# Patient Record
Sex: Female | Born: 1960 | Race: Black or African American | Hispanic: No | Marital: Single
Health system: Southern US, Community
[De-identification: ages and names within clinical notes are randomized; demographics above are authoritative.]

## PROBLEM LIST (undated history)

## (undated) DIAGNOSIS — I639 Cerebral infarction, unspecified: Secondary | ICD-10-CM

## (undated) DIAGNOSIS — J449 Chronic obstructive pulmonary disease, unspecified: Secondary | ICD-10-CM

## (undated) DIAGNOSIS — I10 Essential (primary) hypertension: Secondary | ICD-10-CM

## (undated) DIAGNOSIS — G4730 Sleep apnea, unspecified: Secondary | ICD-10-CM

## (undated) HISTORY — DX: Cerebral infarction, unspecified: I63.9

## (undated) HISTORY — DX: Chronic obstructive pulmonary disease, unspecified: J44.9

## (undated) HISTORY — DX: Sleep apnea, unspecified: G47.30

## (undated) HISTORY — DX: Essential (primary) hypertension: I10

---

## 2006-04-08 ENCOUNTER — Ambulatory Visit: Payer: Self-pay | Admitting: Pulmonary Disease

## 2006-04-08 ENCOUNTER — Inpatient Hospital Stay (HOSPITAL_COMMUNITY): Admission: AD | Admit: 2006-04-08 | Discharge: 2006-04-13 | Payer: Self-pay | Admitting: Pulmonary Disease

## 2006-04-11 ENCOUNTER — Encounter (INDEPENDENT_AMBULATORY_CARE_PROVIDER_SITE_OTHER): Payer: Self-pay | Admitting: Specialist

## 2007-10-15 IMAGING — CR DG CHEST 1V PORT
1 series · 1 of 1 positions shown · non-contrast
Comparison: None.

CLINICAL DATA: Hemoptysis.  Possible tuberculosis.
 AP PORTABLE CHEST ? 1 VIEW ? 04/09/06 AT 8788 HOURS:

[view not recorded]
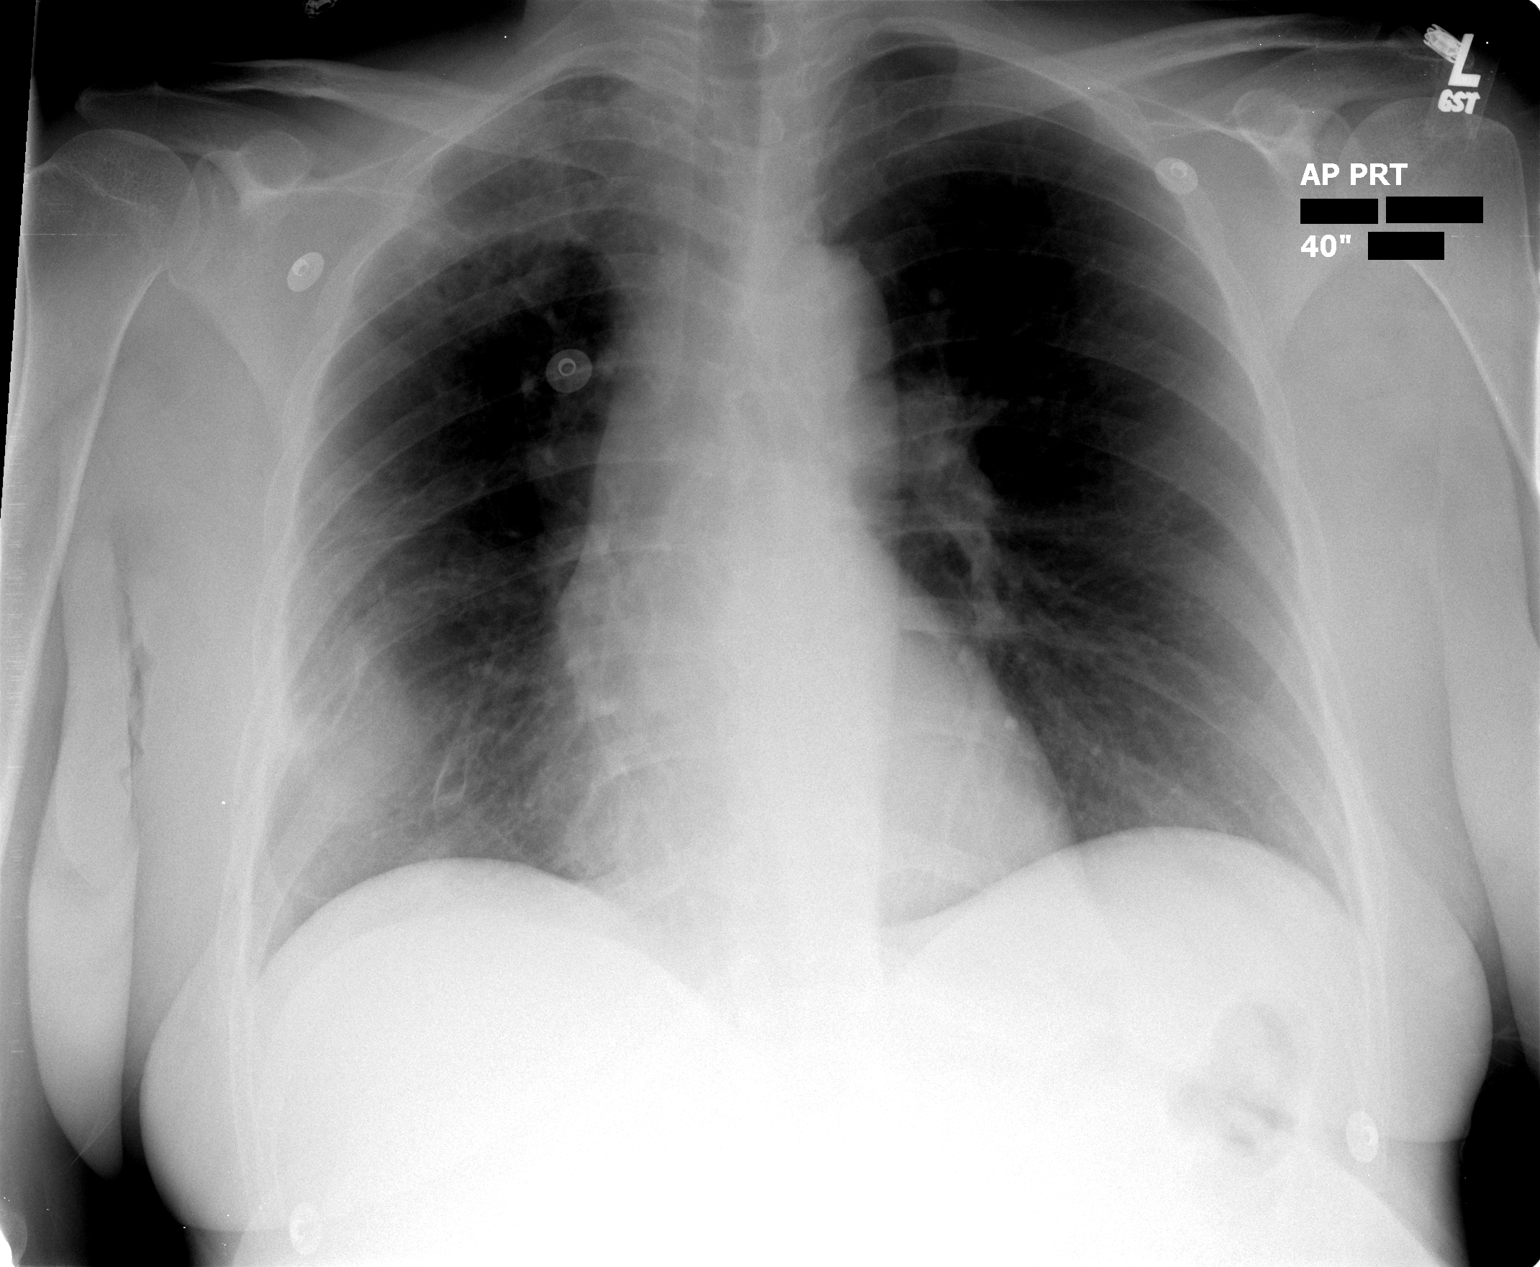

[1 of 1 positions shown; findings below may reference images not displayed]

FINDINGS: Cavitary lesion in the right upper lobe measures approximately 6 cm.  There is a focal alveolar density in the right lung base.  The left lung is clear.  The cardiopericardial silhouette is within normal limits for size.  The bony structures of the visualized thorax are intact.
IMPRESSION: A 6 cm cavitary right upper lobe pulmonary lesion with ill-defined density at the right lung base.  This could be related to an infectious process, including tuberculosis, but neoplasm cannot be excluded.  Close clinical correlation is recommended, and consider follow-up CT to further evaluate.

## 2009-09-14 ENCOUNTER — Other Ambulatory Visit: Admission: RE | Admit: 2009-09-14 | Discharge: 2009-09-14 | Payer: Self-pay | Admitting: Oncology

## 2009-10-13 ENCOUNTER — Ambulatory Visit: Payer: Self-pay | Admitting: Internal Medicine

## 2009-10-17 LAB — CBC WITH DIFFERENTIAL/PLATELET
BASO%: 0.8 % (ref 0.0–2.0)
EOS%: 1.5 % (ref 0.0–7.0)
HCT: 36.7 % (ref 34.8–46.6)
LYMPH%: 47.8 % (ref 14.0–49.7)
MCH: 28 pg (ref 25.1–34.0)
MCHC: 33.3 g/dL (ref 31.5–36.0)
MCV: 84 fL (ref 79.5–101.0)
MONO#: 0.2 10*3/uL (ref 0.1–0.9)
MONO%: 6.2 % (ref 0.0–14.0)
NEUT%: 43.7 % (ref 38.4–76.8)
Platelets: 169 10*3/uL (ref 145–400)

## 2009-10-17 LAB — COMPREHENSIVE METABOLIC PANEL
ALT: 8 U/L (ref 0–35)
CO2: 23 mEq/L (ref 19–32)
Creatinine, Ser: 0.89 mg/dL (ref 0.40–1.20)
Total Bilirubin: 0.2 mg/dL — ABNORMAL LOW (ref 0.3–1.2)

## 2009-10-17 LAB — LACTATE DEHYDROGENASE: LDH: 137 U/L (ref 94–250)

## 2009-11-02 ENCOUNTER — Ambulatory Visit (HOSPITAL_COMMUNITY): Admission: RE | Admit: 2009-11-02 | Discharge: 2009-11-02 | Payer: Self-pay | Admitting: Internal Medicine

## 2009-11-14 ENCOUNTER — Ambulatory Visit: Payer: Self-pay | Admitting: Internal Medicine

## 2009-11-16 LAB — CBC WITH DIFFERENTIAL/PLATELET
Basophils Absolute: 0 10*3/uL (ref 0.0–0.1)
HCT: 36.8 % (ref 34.8–46.6)
HGB: 11.9 g/dL (ref 11.6–15.9)
MCH: 27.4 pg (ref 25.1–34.0)
MONO#: 0.2 10*3/uL (ref 0.1–0.9)
NEUT%: 46.6 % (ref 38.4–76.8)
lymph#: 1.3 10*3/uL (ref 0.9–3.3)

## 2009-11-17 LAB — HEPATITIS PANEL, ACUTE
HCV Ab: NEGATIVE
Hep A IgM: NEGATIVE
Hep B C IgM: NEGATIVE
Hepatitis B Surface Ag: NEGATIVE

## 2009-11-17 LAB — RHEUMATOID FACTOR: Rhuematoid fact SerPl-aCnc: <20 IU/mL (ref 0–20)

## 2010-01-21 ENCOUNTER — Encounter: Payer: Self-pay | Admitting: Internal Medicine

## 2010-03-14 LAB — PROTIME-INR: INR: 0.98 (ref 0.00–1.49)

## 2010-03-14 LAB — CBC
Platelets: 173 10*3/uL (ref 150–400)
RDW: 13 % (ref 11.5–15.5)
WBC: 3 10*3/uL — ABNORMAL LOW (ref 4.0–10.5)

## 2010-03-14 LAB — BASIC METABOLIC PANEL
BUN: 14 mg/dL (ref 6–23)
Creatinine, Ser: 0.81 mg/dL (ref 0.4–1.2)
GFR calc non Af Amer: >60 mL/min (ref >60)

## 2010-03-14 LAB — APTT: aPTT: 28 seconds (ref 24–37)

## 2010-05-19 NOTE — Op Note (Signed)
NAME:  Lori Wall, Lori Wall                ACCOUNT NO.:  [id number]   MEDICAL RECORD NO.:  [id number]          PATIENT TYPE:  INP   LOCATION:  6702                         FACILITY:  [facility]   PHYSICIAN:  Coralyn Helling, MD        DATE OF BIRTH:  06-Mar-1960   DATE OF PROCEDURE:  DATE OF DISCHARGE:                               OPERATIVE REPORT   PROCEDURE:  Bronchoscopy with bronchoalveolar lavage and brush cytology.   INDICATIONS:  This is a 50 year old female who was diagnosed with  tuberculosis in 2006.  She received direct observational therapy.  She  presented with recurrence of hemoptysis and was found to have a new  right upper lobe cavitary lesion.  The procedure was explained to the  patient.  Informed consent was signed.   The patient was brought to the bronchoscopy suite.  She was placed in  respiratory isolation.  She was given a total of 10 mg of Versed and 100  mcg of fentanyl intravenously for sedation and analgesia.  She had  Cetacaine spray to her posterior pharynx.  The bronchoscope was entered  through her mouth.  The vocal cords were visualized and appeared normal  with normal movement.  Lidocaine 2% was instilled as needed for topical  anesthesia.  The bronchoscope was entered into the trachea.  The carina  was visualized and did not appear to be widened.  The bronchoscope was  then entered into the left main bronchus.  The left upper, lingular and  lower lobe orifices were visualized.  There were no obvious  endobronchial lesions and the mucosa appeared normal.  The bronchoscope  was then entered into the right main bronchus.  The right middle and  lower lobe orifices were visualized and appeared to have normal mucosa  with no obvious endobronchial lesions.  Focus was then shifted to the  right upper lobe bronchus.  There are no obvious endobronchial lesions.  Mucosa appeared normal.  A total of 120 mL of saline was instilled to  the right upper lobe for bronchoalveolar  lavage with cloudy white-  colored fluid returning.  Brush cytology was then done from the right  upper lobe.  There was a minimal amount of bleeding, which resolved  spontaneously.  The patient had an episode of sinus tachycardia, which  resolved at the end of the procedure.  The bronchoscope was withdrawn  and there were no immediate obvious complications.  The patient was  recovered in the bronchoscopy suite.  The specimens will be sent for  microbiology and cytology, and I will discuss the results with the  patient when they are available.      Coralyn Helling, MD  Electronically Signed     VS/MEDQ  D:  04/11/2006  T:  04/12/2006  Job:  [id number]

## 2010-05-19 NOTE — H&P (Signed)
NAME:  Lori Wall, Lori Wall                ACCOUNT NO.:  [id number]   MEDICAL RECORD NO.:  [id number]          PATIENT TYPE:  INP   LOCATION:  2912                         FACILITY:  [facility]   PHYSICIAN:  Coralyn Helling, MD        DATE OF BIRTH:  1960/04/12   DATE OF ADMISSION:  04/08/2006  DATE OF DISCHARGE:                              HISTORY & PHYSICAL   ADMISSION DIAGNOSES:  Hemoptysis and abnormal chest x-ray.   Lori Wall is a 50 year old female who was received in transfer from  [facility] [place]  She had  presented there after having developed episodes of coughing with sputum  mixed with blood.  She said that this had started earlier in the day.  She denied having any fever, chills, sweats, weight loss.  She denies  any headaches, dizziness, blurred vision, sinus congestion, epistaxis or  difficulty swallowing.  She also denies any abdominal pain, nausea or  vomiting.  She denies any diarrhea, leg swelling, skin rashes or joint  swelling.  She says that she does have a history of alcohol abuse but  that she has not drunk much over the last 1 month, although she did say  that she had few drinks at a wedding recently. She denies any recent  sick exposure.  She denies any travel history.  There is no significant  occupational exposure.  She did not have any exposure to animals, pets  or birds.   PAST MEDICAL HISTORY:  Significant for:  1. Alcohol abuse.  2. Cocaine abuse.  3. Reflux.  4. Hypertension.  5. Alcoholic ketoacidosis.  6. Pancreatitis.   PAST SURGERIES:  History significant for cholecystectomy and  hysterectomy.   ALLERGIES:  She has no known drug allergies.   MEDICATIONS:  1. Hydrocodone 5/500 1 tablet q.4 h p.r.n.  2. Folic acid 1 mg daily.  3. Cefdinir 300 mg b.i.d.  4. Thiamine 100 mg daily.  5. Zofran 2 mg b.i.d. p.r.n.  6. Prevacid 30 mg daily.  7. Aspirin 11 mg daily.  8. Prenatal Plus with iron 2 tablets once daily.  9.  Cerefolin MAC 1 tablet daily.  10.Micardis 40 mg daily.  11.Cymbalta 60 mg daily.  12.Levemir 10 units nightly.   FAMILY HISTORY:  Noncontributory.   REVIEW OF SYSTEMS:  Essentially negative except as stated above.   PHYSICAL EXAMINATION:  VITAL SIGNS:  Temperature is 98.7, blood pressure  137/96, heart rate 91, respiratory rate 15.  Saturation 100% on room  air.  HEENT: Pupils reactive.  There is no sinus tenderness.  No nasal  discharge.  No oral lesions.  NECK:  No lymphadenopathy.  HEART:  S1 and S2 with regular rate and rhythm.  CHEST:  No wheezing or rales.  ABDOMEN:  Thin, soft, nontender.  Positive bowel sounds.  EXTREMITIES: There is no edema, cyanosis or clubbing.  NEUROLOGIC:  No focal deficits were appreciated.   CT scan of the chest done at [facility] from earlier  in the day showed a cavitary lesion in the right upper lobe as well  as a  pleural-based lesion in the right lower lung fields which have some  calcifications.   Sodium is 137, potassium 3.2, chloride 110, CO2 20, BUN 10, creatinine  0.7, glucose 99,  calcium 9.4. INR is 1.05.  WBC 2.8, hemoglobin 10.9,  hematocrit 31.1, platelet count 161.   EKG showed sinus tachycardia   IMPRESSION:  1. This is a 50 year old female who is received in transfer with scant      hemoptysis and abnormalities on CT scan as stated above.  She does      have a history of previous tobacco abuse as well as cocaine abuse      in addition to alcohol abuse.  The concern that I would have is      that this may represent either an infectious etiology versus      malignancy.  I will evaluate her first for possibility of      tuberculosis. I will place her on isolation, check sputum for AFB,      and place a PPD. If she is not able to expectorate on her own, I      will have respiratory therapy induce sputum.  Depending upon the      results of this, I would consider having her undergo bronchoscopy.      I will  additionally start her on Augmentin in case she has had an      episode of aspiration with abscess formation.  2. Hypertension.  I will continue her on Micardis.  3. Anxiety.  I will continue her on Cymbalta.  4. Diabetes.  I will continue her on Lantus and place her on a sliding      scale protocol as well as modified-carbohydrate diet.  5. History of substance abuse.  I will continue her on thiamine and      folic acid.  I will also check a urine drug screen as well as an      HIV test.  6. I will have her transferred to a medical floor in isolation.      Coralyn Helling, MD  Electronically Signed     VS/MEDQ  D:  04/08/2006  T:  04/08/2006  Job:  [id number]

## 2010-05-19 NOTE — Discharge Summary (Signed)
NAME:  Lori Wall, Lori Wall                ACCOUNT NO.:  [id number]   MEDICAL RECORD NO.:  [id number]          PATIENT TYPE:  INP   LOCATION:  6702                         FACILITY:  [facility]   PHYSICIAN:  Lonzo Cloud. Kriste Basque, MD     DATE OF BIRTH:  1960/11/30   DATE OF ADMISSION:  04/08/2006  DATE OF DISCHARGE:  04/13/2006                               DISCHARGE SUMMARY   FINAL DIAGNOSES:  1. Admitted April 08, 2006 on transfer from [facility] by Dr. Ivette Loyal      with hemoptysis and an abnormal chest x-ray. The patient has a      history of TB treated at the [facility] in      2006 with a four-drug regimen and careful monitoring. By all      accounts, TB has been adequately treated and eradicated. Current      chest x-ray showed a cavity right upper lobe lesion, and patient      had sputum with streaky hemoptysis.  2. The patient was accepted in transfer to [facility] in      [place] by Dr. Danice Goltz. She was attended by Dr. Coralyn Helling and discharged by Dr. Alroy Dust. Evaluation included      expectorated and induced sputum for AFB - all negative.      Bronchoscopy with lavage - all AFB smears negative, and cultures      will be pending over the next six weeks. Post bronchoscopy sputum      specimen negative as well. The patient treated for right upper lobe      cavity with Augmentin with clinical improvement and is being      discharged to finish an outpatient course with careful follow up by      her local physicians in [facility].  3. History of hypertension.  4. History of polysubstance abuse.  5. History of chronic pancreatitis.  6. Anemia, hemoglobin 9.6.  7. HIV antibody testing, indeterminate for HIV-1 and negative for HIV-      2 - this should be followed up by her local physicians with repeat      testing.   BRIEF HISTORY AND PHYSICAL:  This 50 year old black female  was  transferred from [facility] in [place], [place]  with hemoptysis. She reported sputum mixed with blood that started  earlier on the day of admission. She denied any fevers, chills, sweats  or weight loss. She states her appetite is good. She denied any  headache, dizziness, blurred vision, sinus congestion, epistaxis or  difficulty swallowing. She also denied abdominal pain, nausea and  vomiting. She noted a history of alcohol abuse but states she had had  nothing to drink for the last month prior to admission. She denied any  recent sick exposures or recent travel. Similarly, there was no  significant occupational exposure or exposure to animals, pets or birds.  She denied chest pain, pleuritic or otherwise.   PAST MEDICAL HISTORY:  She has a history of pulmonary TB diagnosed in  December of 2006 at [facility] in [place]. She is  followed in the [facility] Department and received  a full course of chemotherapy under observation. By all accounts, she  had her TB eradicated with this therapy and had had no problems until  this admission. As noted, she does have a history of alcohol abuse and  polysubstance abuse. She did not know her HIV status. She has insulin-  dependent diabetes and is followed by her local physician.   Her home medications include Micardis for hypertension, Cymbalta for  depression, Levimir insulin, Prevacid, vitamin regimen and Vicodin as  needed for pain.   PHYSICAL EXAMINATION:  At the time of admission revealed a 50 year old  black female in no acute distress. Blood pressure 130/90, pulse 90 and  regular, respirations 16 per minute and nonlabored. O2 saturation 100%  on room air. Temperature 98.6.  HEENT EXAM:  Unremarkable.  NECK:  Revealed no lymphadenopathy.  CHEST EXAM:  Was clear to percussion and auscultation.  CARDIAC EXAM:  Revealed a regular rhythm, normal S1 and S2 without  murmurs,  rubs, or gallops heard.  ABDOMEN:  Was thin, soft, nontender without evidence of organomegaly or  masses.  EXTREMITIES:  Showed no clubbing, cyanosis, or edema.  NEUROLOGICAL EXAM:  Intact without abnormalities detected.   LABORATORY DATA:  Chest x-ray revealed a 6-cm right upper lobe cavity.  She had numerous labs from the transferring emergency room which were  reviewed. She had CBC here that showed a hemoglobin of 9.6, hematocrit  28.5, white count 3500 and a platelet count of 163,000. Her sodium was  133, potassium 3.3, chloride 97, CO2 25, BUN 6, creatinine 0.6, blood  sugar 92. Urine drug screen was negative across the board. She had  initial sputum studies which were negative for bacterial culture,  negative AFB and fungal smears. She subsequently had bronchoscopy with  lavage, and the lavage fluid was negative for AFB and fungi and cultured  only normal oropharyngeal flora. She had a post bronchoscopic sputum  specimen as well as that was negative for AFB and fungi. She had a  bronchial brushing from the right upper lobe that was negative for AFB  as well.   HOSPITAL COURSE:  The patient was attended by Dr. Coralyn Helling during her  hospitalization. Dr. Craige Cotta started her on Augmentin and obtained old  records from [facility]. When expectorated and induced sputum  was negative, he performed a bronchoscopy on April 11, 2006 which showed  no evidence of any endobronchial abnormality. As noted, the bronchial  washings were negative for AFB and fungi and bronchial brushings. The  right upper lobe was negative as well. She tolerated the procedure well.   When specimens returned negative for AFB and the patient had improved  clinically with resolution of her hemoptysis on the Augmentin, she was  felt to be ready for discharge. The patient is discharged on Augmentin  875 one tablet p.o. b.i.d. with planned followup by her physicians in [facility] including Dr. Neldon Mc and  her pulmonologist, Dr. Ozella Rocks. She will also follow up with [facility]  Department. The patient was informed that her HIV status showed  indeterminate for HIV-1 and negative for HIV-2, and she was told this  will need to be repeated by her physicians in [facility]. She was  given a prescription for the Augmentin and discharged improved.  MEDICATIONS AT DISCHARGE:  Augmentin 875 one tablet p.o. b.i.d. for 7  more days. The patient was told to resume her home medicines including:  1. Micardis 40 mg p.o. daily.  2. Prevacid 30 mg p.o. daily.  3. Cymbalta 60 mg p.o. daily.  4. Levimir insulin 10 units a day.  5. Multivitamin daily.  6. Thiamine 100 mg p.o. daily.  7. Folic acid 1 mg p.o. daily.  8. Vicodin as needed for pain.      Lonzo Cloud. Kriste Basque, MD  Electronically Signed     SMN/MEDQ  D:  04/13/2006  T:  04/13/2006  Job:  [id number]   cc:   [facility]  Jonette Mate, M.D.  Ozella Rocks, M.D.  Ivette Loyal, M.D.  Daleen Bo, M.D.

## 2011-05-10 IMAGING — CT CT BIOPSY
1 series · 15 of 31 positions shown, 19 images · non-contrast
Comparison: none

CLINICAL DATA: Neutropenia.

[Series 2: bone marrow bx · axial · 0.55mm/px · z∈[-222,-137]mm · 15 of 31 slices shown, 19 images]
[im 3/31  soft-tissue]
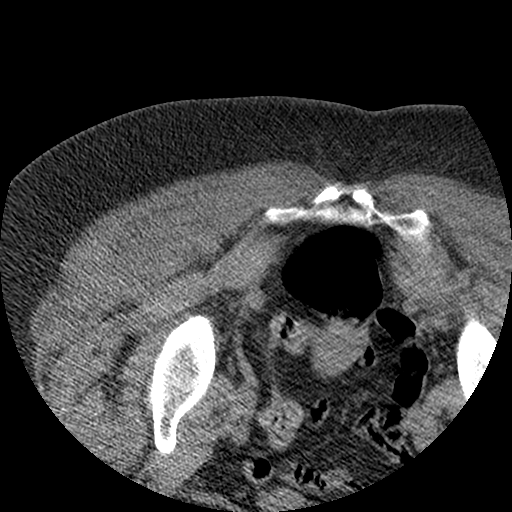
[im 3/31  bone]
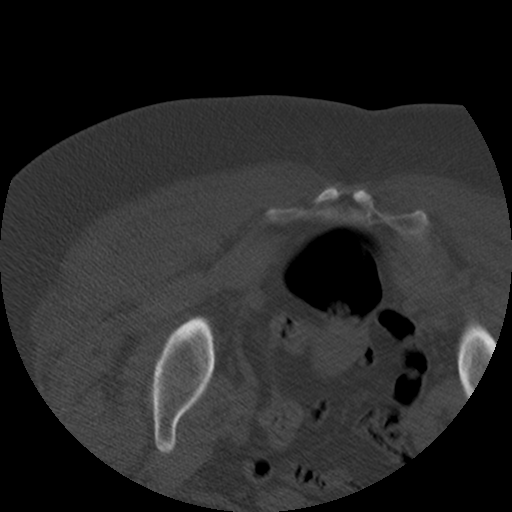
[im 5/31  soft-tissue]
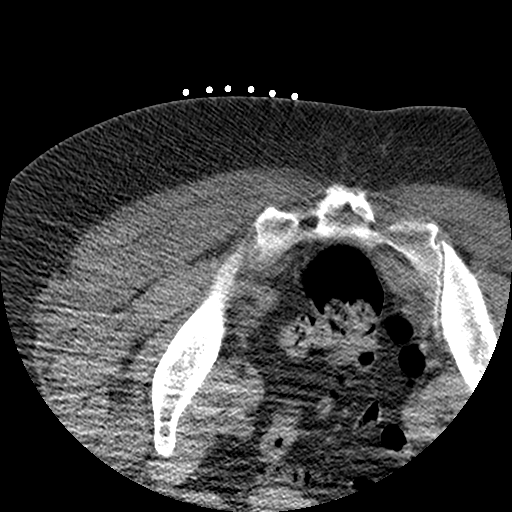
[im 7/31  soft-tissue]
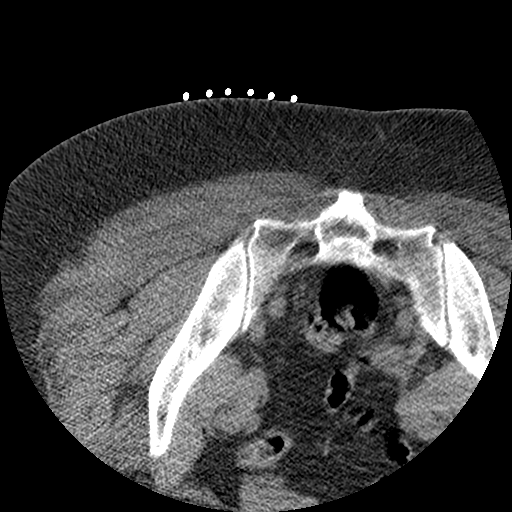
[im 9/31  soft-tissue]
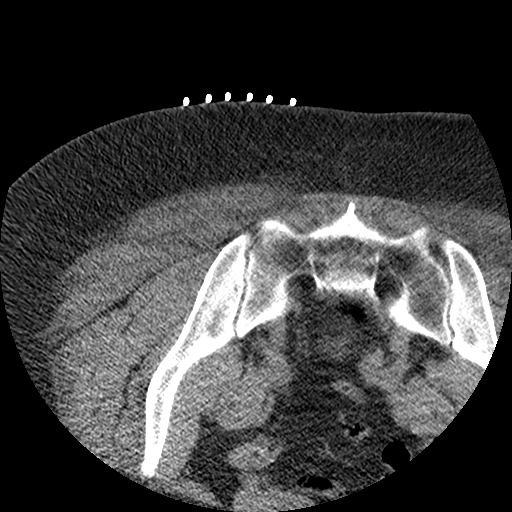
[im 12/31  soft-tissue]
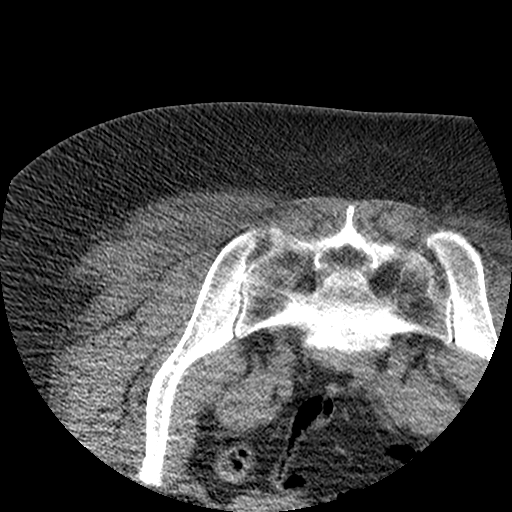
[im 14/31  soft-tissue]
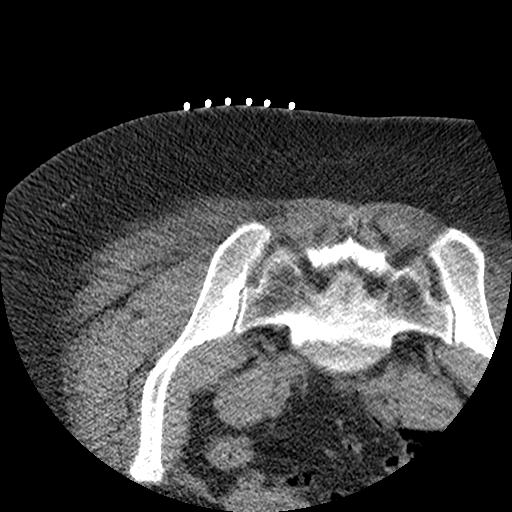
[im 16/31  soft-tissue]
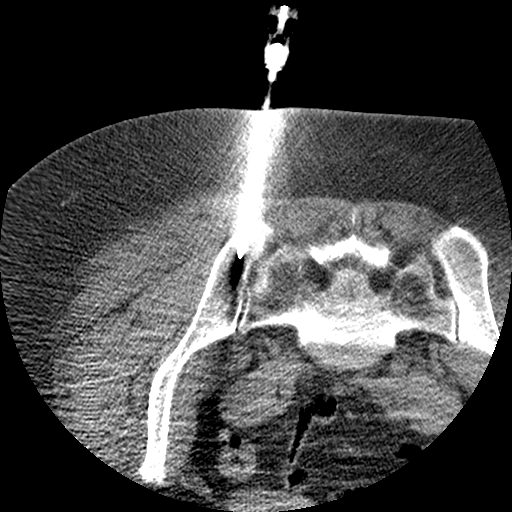
[im 18/31  soft-tissue]
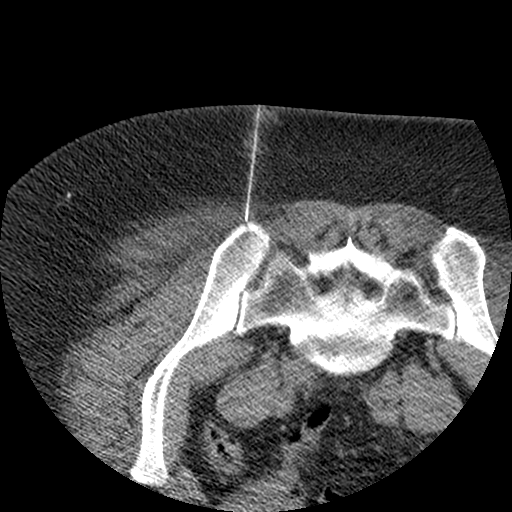
[im 20/31  soft-tissue]
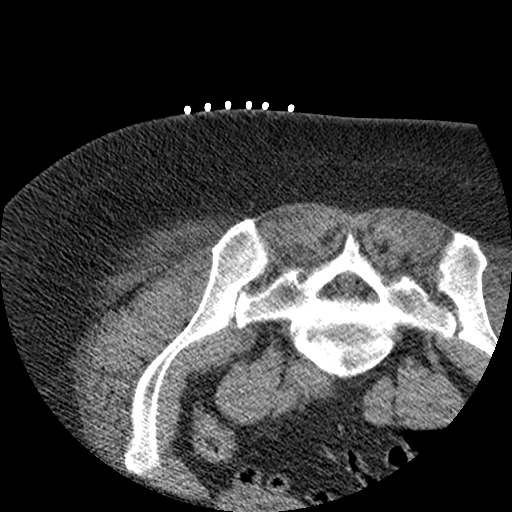
[im 20/31  bone]
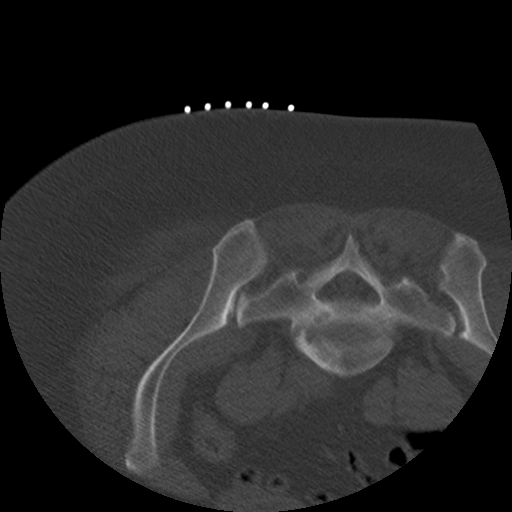
[im 23/31  soft-tissue]
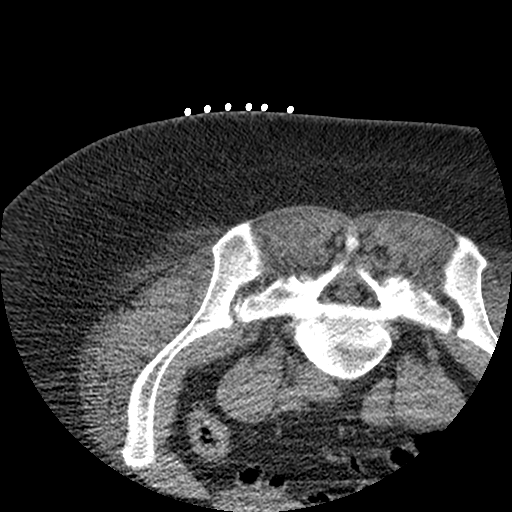
[im 25/31  soft-tissue]
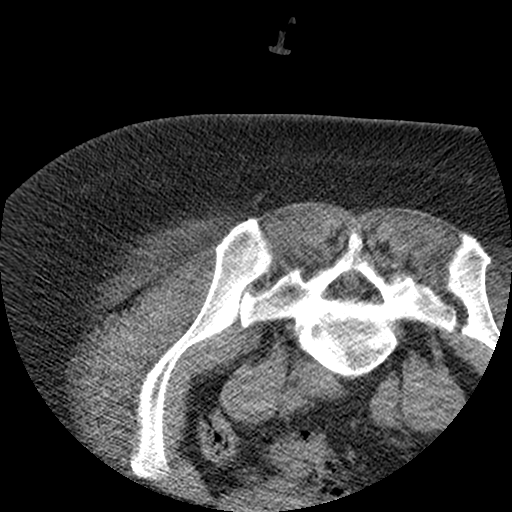
[im 27/31  soft-tissue]
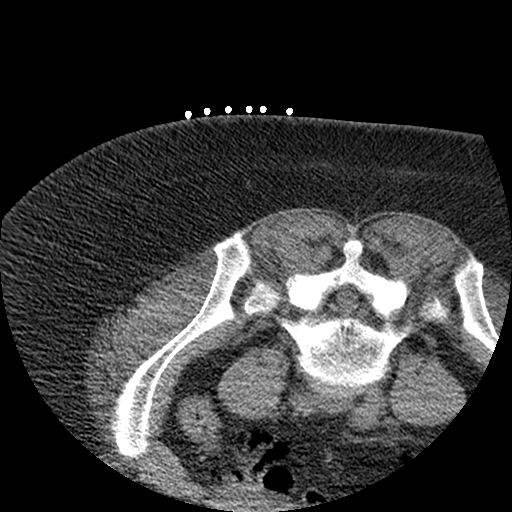
[im 27/31  lung]
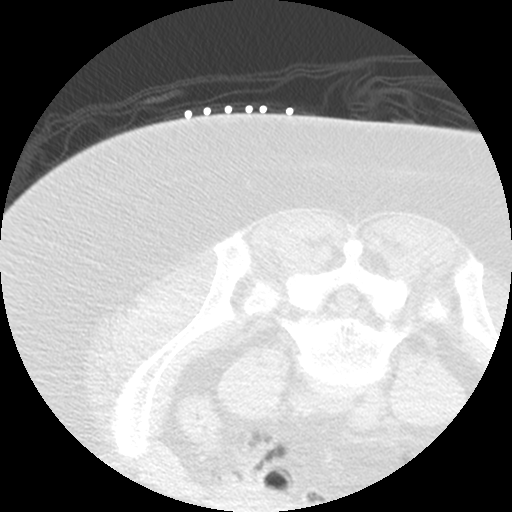
[im 28/31  lung]
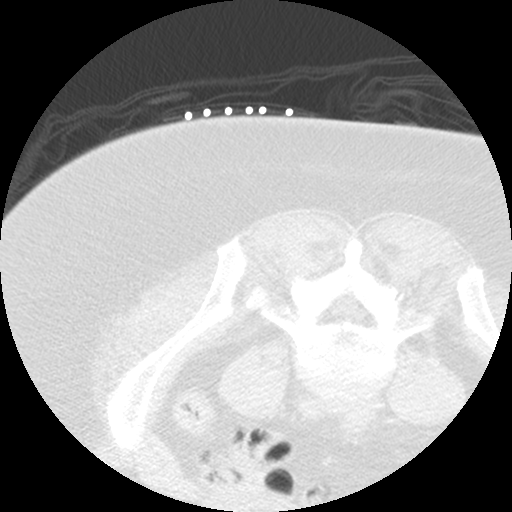
[im 29/31  soft-tissue]
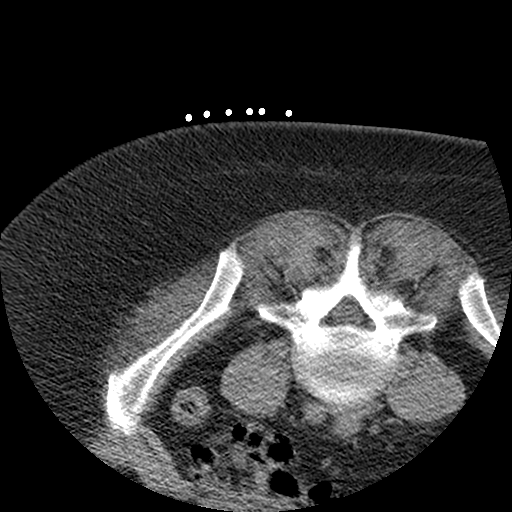
[im 29/31  lung]
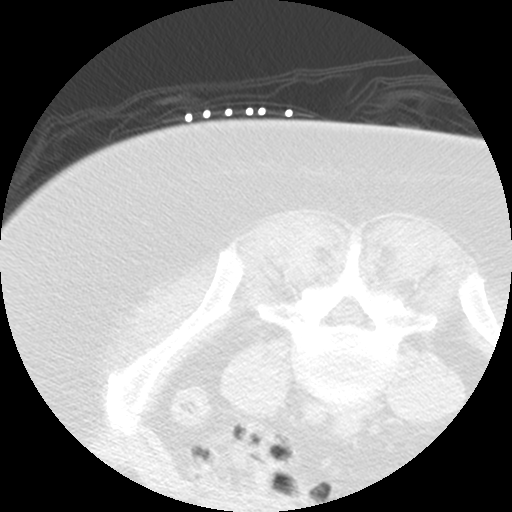
[im 30/31  lung]
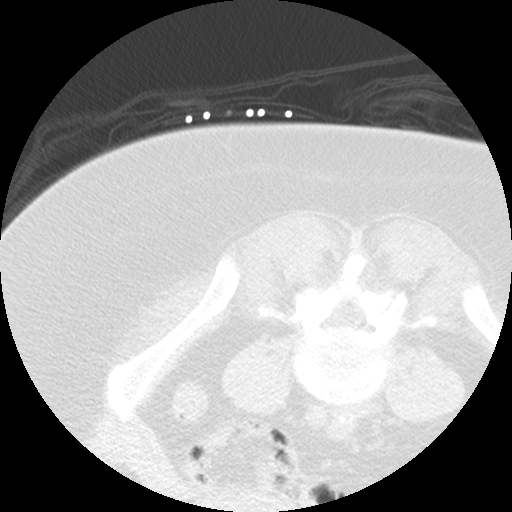

[15 of 31 positions shown; findings below may reference images not displayed]

CT GUIDED ASPIRATE AND CORE  BIOPSY OF  LEFT ILIAC BONE MARROW

Sedation: Versed 2.5 mg IV, Fentanyl 100 mcg IV

Total Moderate Sedation Time: 12 minutes.

Procedure:  The procedure risks, benefits, and alternatives were
explained to the patient.  Questions regarding the procedure were
encouraged and answered.  The patient understands and consents to
the procedure.

The left gluteal region was prepped with Betadine.  Sterile gown
and sterile gloves were used for the procedure.  Local anesthesia
was provided with 1% Lidocaine.

Under CT guidance, an 11 gauge bone cutting needle was advanced
from a posterior approach into the left iliac bone.  Needle
positioning was confirmed with CT.  Initial non heparinized and
heparinized aspirate samples were obtained of bone marrow.

Core biopsy was performed with coaxial placement of a 14 gauge core
biopsy needle.  Multiple core samples of the marrow were obtained.

Complications: None
FINDINGS: Inspection of initial aspirate did reveal visible
particles.  Intact core biopsy samples were able to be obtained.
IMPRESSION: CT guided bone marrow biopsy of left posterior iliac bone with both
aspirate and core samples obtained.

## 2016-04-27 DIAGNOSIS — D72819 Decreased white blood cell count, unspecified: Secondary | ICD-10-CM | POA: Diagnosis not present

## 2016-04-27 DIAGNOSIS — K7460 Unspecified cirrhosis of liver: Secondary | ICD-10-CM | POA: Diagnosis not present

## 2016-04-27 DIAGNOSIS — Z86010 Personal history of colonic polyps: Secondary | ICD-10-CM | POA: Diagnosis not present

## 2017-09-04 DIAGNOSIS — K7460 Unspecified cirrhosis of liver: Secondary | ICD-10-CM

## 2017-09-04 DIAGNOSIS — D709 Neutropenia, unspecified: Secondary | ICD-10-CM

## 2017-09-04 DIAGNOSIS — D72819 Decreased white blood cell count, unspecified: Secondary | ICD-10-CM | POA: Diagnosis not present

## 2018-01-10 ENCOUNTER — Encounter: Payer: Self-pay | Admitting: Neurology

## 2018-02-25 ENCOUNTER — Encounter: Payer: Self-pay | Admitting: Neurology

## 2018-02-25 ENCOUNTER — Ambulatory Visit (INDEPENDENT_AMBULATORY_CARE_PROVIDER_SITE_OTHER): Payer: Medicare Other | Admitting: Neurology

## 2018-02-25 ENCOUNTER — Other Ambulatory Visit: Payer: Self-pay

## 2018-02-25 ENCOUNTER — Other Ambulatory Visit (INDEPENDENT_AMBULATORY_CARE_PROVIDER_SITE_OTHER): Payer: Medicare Other

## 2018-02-25 VITALS — BP 110/72 | HR 77 | Ht 66.0 in | Wt 218.0 lb

## 2018-02-25 DIAGNOSIS — F0150 Vascular dementia without behavioral disturbance: Secondary | ICD-10-CM | POA: Diagnosis not present

## 2018-02-25 DIAGNOSIS — R413 Other amnesia: Secondary | ICD-10-CM

## 2018-02-25 MED ORDER — DONEPEZIL HCL 10 MG PO TABS: ORAL_TABLET | ORAL | 11 refills | Status: DC

## 2018-02-25 NOTE — Progress Notes (Signed)
NEUROLOGY CONSULTATION NOTE  Lori Wall MRN: [id number] DOB: 1960-06-05  Referring provider: Welford Roche, NP Primary care provider: Welford Roche, NP  Reason for consult:  forgetfulness  Thank you for your kind referral of Lori Wall for consultation of the above symptoms. Although her history is well known to you, please allow me to reiterate it for the purpose of our medical record. The patient was accompanied to the clinic by her daughter who also provides collateral information. Records and images were personally reviewed where available.  HISTORY OF PRESENT ILLNESS: This is a pleasant 58 year old right-handed woman with a history of hypertension, sleep apnea, stroke in 2008 with residual mild right-sided weakness, presenting for evaluation of forgetfulness. She is a poor historian and mostly says that she is forgetful. Her daughter provides majority of the history. Her daughter reports that she had a seizure first in 2008 then had the stroke affecting her right side and speech. She was on life support for a week. She was living in [place] at that time, she had to start all over again, learning to eat, talk, and walk. Her daughter reports the stroke was due to alcohol abuse, she thinks there may have been a bleed (no prior records available). She moved in with her daughter after the stroke and has not been drinking any alcohol since 2008. Her daughter reports "brain damage" from the stroke with some memory issues, that have worsened over the past 3-4 months where family would ask her to do something and she would do something else. For instance, they would ask her to get the trash and she would go to the sink instead. Family has also noticed that she is more off balance, no falls. The patient states "I keep forgetting." She misplaces her glasses frequently and says her daughter moved it. She has left the faucet running. She denies leaving the stove on. Her daughter has been  managing finances and medications since the stroke. She does not drive. She is noted to be looking off to a distance several times during the visit, sometimes with a brief lag in response but answers questions appropriate after. Her daughter reports she does this at home sometimes, no unresponsive episodes. No further seizures since 2008. She has had headaches since the stroke and was previously seen in a neurology practice in [place] in 2016 where she had an MRI brain reporting progressive atrophy and white matter disease since 2010. 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 This abnormality was not present in 2010. She had a follow-up CTA reporting distal right vertebral artery is patent but non dominant. This was suspected to be a normal anatomic variation rather than related to right vertebral artery stenosis in the neck. Headaches occur every other day with pressure in the left frontal region, no associated nausea/vomiting. She is sensitive to bright lights. She has been taking verapamil for HTN and headache prevention. She has been taking oxycodone BID for headaches since the stroke, no new changes in doses. She has occasional dizziness with spinning sensation when standing. No diplopia, dysphagia, neck/back pain, focal numbness/tingling, bowel/bladder dysfunction. No family history of dementia. No history of significant injuries. Mood is up and down, there is note of citalopram being started last month, but they deny taking it. Her daughter reports she has "always been tender-hearted." Daughter notes she is restless and cannot stay still. No paranoia or hallucinations. Sleep is good, she yells  in her sleep. They report her mother passed away last year and this has been very hard on her.   PAST MEDICAL HISTORY: Past Medical History:  Diagnosis Date  . COPD (chronic obstructive pulmonary disease) ([place])   . Hypertension   . Sleep apnea    . Stroke [facility])     PAST SURGICAL HISTORY: History reviewed. No pertinent surgical history.  MEDICATIONS:  Outpatient Encounter Medications as of 02/25/2018  Medication Sig  . Magnesium Oxide 250 MG TABS Take by mouth.  Karma Greaser 4 MG/0.1ML LIQD nasal spray kit   . Oxycodone HCl 10 MG TABS Take 10 mg by mouth 3 (three) times daily.  . verapamil (CALAN) 120 MG tablet Take by mouth.  .    .     No facility-administered encounter medications on file as of 02/25/2018.    ALLERGIES: No Known Allergies  FAMILY HISTORY: History reviewed. No pertinent family history.  SOCIAL HISTORY: Social History   Socioeconomic History  . Marital status: Single    Spouse name: Not on file  . Number of children: Not on file  . Years of education: Not on file  . Highest education level: Not on file  Occupational History  . Not on file  Social Needs  . Financial resource strain: Not on file  . Food insecurity:    Worry: Not on file    Inability: Not on file  . Transportation needs:    Medical: Not on file    Non-medical: Not on file  Tobacco Use  . Smoking status: Not on file  Substance and Sexual Activity  . Alcohol use: Not on file  . Drug use: Not on file  . Sexual activity: Not on file  Lifestyle  . Physical activity:    Days per week: Not on file    Minutes per session: Not on file  . Stress: Not on file  Relationships  . Social connections:    Talks on phone: Not on file    Gets together: Not on file    Attends religious service: Not on file    Active member of club or organization: Not on file    Attends meetings of clubs or organizations: Not on file    Relationship status: Not on file  . Intimate partner violence:    Fear of current or ex partner: Not on file    Emotionally abused: Not on file    Physically abused: Not on file    Forced sexual activity: Not on file  Other Topics Concern  . Not on file  Social History Narrative  . Not on file    REVIEW OF  SYSTEMS: Constitutional: No fevers, chills, or sweats, no generalized fatigue, change in appetite Eyes: No visual changes, double vision, eye pain Ear, nose and throat: No hearing loss, ear pain, nasal congestion, sore throat Cardiovascular: No chest pain, palpitations Respiratory:  No shortness of breath at rest or with exertion, wheezes GastrointestinaI: No nausea, vomiting, diarrhea, abdominal pain, fecal incontinence Genitourinary:  No dysuria, urinary retention or frequency Musculoskeletal:  No neck pain, back pain Integumentary: No rash, pruritus, skin lesions Neurological: as above Psychiatric: No depression, insomnia, anxiety Endocrine: No palpitations, fatigue, diaphoresis, mood swings, change in appetite, change in weight, increased thirst Hematologic/Lymphatic:  No anemia, purpura, petechiae. Allergic/Immunologic: no itchy/runny eyes, nasal congestion, recent allergic reactions, rashes  PHYSICAL EXAM: Vitals:   02/25/18 0938  BP: 110/72  Pulse: 77  SpO2: 97%   General: No acute distress. Slowed  responses, looking off to a distance but would respond when called and answers appropriately, follows commands Head:  Normocephalic/atraumatic Eyes: Fundoscopic exam shows bilateral sharp discs, no vessel changes, exudates, or hemorrhages Neck: supple, no paraspinal tenderness, full range of motion Back: No paraspinal tenderness Heart: regular rate and rhythm Lungs: Clear to auscultation bilaterally. Vascular: No carotid bruits. Skin/Extremities: No rash, no edema Neurological Exam: Mental status: alert and oriented to person, place, and time, mild dysarthria, Difficulty naming and repeating. Fund of knowledge is reduced.  Recent and remote memory are impaired.  Attention and concentration are reduced.  Montreal Cognitive Assessment  02/25/2018  Visuospatial/ Executive (0/5) 3  Naming (0/3) 1  Attention: Read list of digits (0/2) 2  Attention: Read list of letters (0/1) 0    Attention: Serial 7 subtraction starting at 100 (0/3) 0  Language: Repeat phrase (0/2) 0  Language : Fluency (0/1) 0  Abstraction (0/2) 0  Delayed Recall (0/5) 3  Orientation (0/6) 5  Total 14  Adjusted Score (based on education) 15   Cranial nerves: CN I: not tested CN II: pupils equal, round and reactive to light, visual fields intact, fundi unremarkable. CN III, IV, VI:  full range of motion, no nystagmus, no ptosis CN V: facial sensation intact CN VII: upper and lower face symmetric CN VIII: hearing intact to finger rub CN IX, X: gag intact, uvula midline CN XI: sternocleidomastoid and trapezius muscles intact CN XII: tongue midline Bulk & Tone: normal, no fasciculations. Motor: 5/5 throughout with no pronator drift. Sensation: intact to light touch, cold, pin, vibration and joint position sense.  No extinction to double simultaneous stimulation.  Romberg test negative Deep Tendon Reflexes: +2 throughout except for absent ankle jerks bilaterally, no ankle clonus Plantar responses: downgoing bilaterally Cerebellar: no incoordination on finger to nose testing Gait: slow and cautious favoring right leg, denies pain, unable to tandem walk Tremor: none  IMPRESSION: This is a pleasant 58 year old right-handed woman with a history of hypertension, stroke in 2008 (?etiology, no notes available), presenting for evaluation of worsening memory. Her neurological exam shows mild right leg weakness (chronic since stroke), MOCA score today 15/30. Symptoms suggestive of vascular dementia. Prior MRI in 2016 reported worsening atrophy and chronic microvascular disease, repeat MRI brain without contrast will be done due to change in symptoms. Check TSH and B12. We discussed how mood can affect memory, it appears citalopram was mentioned by her PCP but she has not started it. Her daughter will look into counseling for her. She does not drive. We discussed starting Donepezil, side effects and  expectations from the medication were discussed. We also discussed the importance of control of vascular risk factors, physical exercise, and brain stimulation exercises for brain health. Follow-up in 6 months, they know to call for any changes.   Thank you for allowing me to participate in the care of this patient. Please do not hesitate to call for any questions or concerns.   Ellouise Newer, M.D.  CC: Welford Roche, NP

## 2018-02-25 NOTE — Patient Instructions (Addendum)
1. Schedule MRI brain without contrast  We have sent a referral to [facility] for your MRI.  They will be in touch with you to schedule your appointment.  2. Bloodwork for TSH, B12  Your provider requests that you have LABS drawn today.  We share a lab with [facility] - they are located in [address] (second floor) of this building.  Once you get there, please have a seat and the phlebotomist will call your name.  If you have waited more than 15 minutes, please advise the front desk    3. Start Donepezil 10mg : Take 1/2 tablet daily for 2 weeks, then increase to 1 tablet daily 4. Agree with seeing a therapist for mood  FALL PRECAUTIONS: Be cautious when walking. Scan the area for obstacles that may increase the risk of trips and falls. When getting up in the mornings, sit up at the edge of the bed for a few minutes before getting out of bed. Consider elevating the bed at the head end to avoid drop of blood pressure when getting up. Walk always in a well-lit room (use night lights in the walls). Avoid area rugs or power cords from appliances in the middle of the walkways. Use a walker or a cane if necessary and consider physical therapy for balance exercise. Get your eyesight checked regularly.  HOME SAFETY: Consider the safety of the kitchen when operating appliances like stoves, microwave oven, and blender. Consider having supervision and share cooking responsibilities until no longer able to participate in those. Accidents with firearms and other hazards in the house should be identified and addressed as well.  ABILITY TO BE LEFT ALONE: If patient is unable to contact 911 operator, consider using LifeLine, or when the need is there, arrange for someone to stay with patients. Smoking is a fire hazard, consider supervision or cessation. Risk of wandering should be assessed by caregiver and if detected at any point, supervision and safe proof recommendations should be  instituted.  RECOMMENDATIONS FOR ALL PATIENTS WITH MEMORY PROBLEMS: 1. Continue to exercise (Recommend 30 minutes of walking everyday, or 3 hours every week) 2. Increase social interactions - continue going to [place] and enjoy social gatherings with friends and family 3. Eat healthy, avoid fried foods and eat more fruits and vegetables 4. Maintain adequate blood pressure, blood sugar, and blood cholesterol level. Reducing the risk of stroke and cardiovascular disease also helps promoting better memory. 5. Avoid stressful situations. Live a simple life and avoid aggravations. Organize your time and prepare for the next day in anticipation. 6. Sleep well, avoid any interruptions of sleep and avoid any distractions in the bedroom that may interfere with adequate sleep quality 7. Avoid sugar, avoid sweets as there is a strong link between excessive sugar intake, diabetes, and cognitive impairment The Mediterranean diet has been shown to help patients reduce the risk of progressive memory disorders and reduces cardiovascular risk. This includes eating fish, eat fruits and green leafy vegetables, nuts like almonds and hazelnuts, walnuts, and also use olive oil. Avoid fast foods and fried foods as much as possible. Avoid sweets and sugar as sugar use has been linked to worsening of memory function.  There is always a concern of gradual progression of memory problems. If this is the case, then we may need to adjust level of care according to patient needs. Support, both to the patient and caregiver, should then be put into place.

## 2018-02-26 LAB — TSH: TSH: 1.51 mIU/L (ref 0.40–4.50)

## 2018-02-26 LAB — VITAMIN B12: VITAMIN B 12: 455 pg/mL (ref 200–1100)

## 2018-02-28 ENCOUNTER — Telehealth: Payer: Self-pay

## 2018-02-28 NOTE — Telephone Encounter (Signed)
Spoke with pt's daughter relaying results below.  

## 2018-02-28 NOTE — Telephone Encounter (Signed)
-----   Message from Van Clines, MD sent at 02/26/2018  9:19 AM EST ----- Pls let her know thyroid and B12 levels are normal. Thanks

## 2018-10-02 ENCOUNTER — Telehealth: Payer: Medicare Other | Admitting: Neurology

## 2018-10-02 ENCOUNTER — Other Ambulatory Visit: Payer: Self-pay

## 2018-10-02 NOTE — Progress Notes (Signed)
Patient's daughter called on day of visit 10/02/2018 to cancel appointment, patient not feeling well

## 2018-10-08 ENCOUNTER — Encounter: Payer: Self-pay | Admitting: Neurology

## 2018-10-08 ENCOUNTER — Telehealth (INDEPENDENT_AMBULATORY_CARE_PROVIDER_SITE_OTHER): Payer: Medicare Other | Admitting: Neurology

## 2018-10-08 ENCOUNTER — Other Ambulatory Visit: Payer: Self-pay

## 2018-10-08 VITALS — Ht 63.0 in | Wt 210.0 lb

## 2018-10-08 DIAGNOSIS — F0150 Vascular dementia without behavioral disturbance: Secondary | ICD-10-CM

## 2018-10-08 NOTE — Progress Notes (Signed)
Virtual Visit via Telephone Note The purpose of this virtual visit is to provide medical care while limiting exposure to the novel coronavirus.    Consent was obtained for phone visit:  Yes.   Answered questions that patient had about telehealth interaction:  Yes.   I discussed the limitations, risks, security and privacy concerns of performing an evaluation and management service by telephone. I also discussed with the patient that there may be a patient responsible charge related to this service. The patient expressed understanding and agreed to proceed.  Pt location: Home Physician Location: office Name of referring provider:  No ref. provider found I connected with .Lori Wall at patients initiation/request on 10/08/2018 at  2:00 PM EDT by telephone and verified that I am speaking with the correct person using two identifiers.  Pt MRN:  [id number] Pt DOB:  [birth year]   History of Present Illness:  The patient had a telephone visit on 10/08/2018. Her daughter Lori Wall was present during the visit to provide additional information. She lives with her daughter. The patient was last seen in the neurology clinic 8 months ago for vascular dementia. MOCA score 15/30 in February 2020. She was started on Donepezil 10mg  daily which she is tolerating without side effects. She states her memory is "alright." Her daughter feels memory is stable. She sometimes forgets where she put her money and thinks someone stole it. Her granddaughter administers medications. Granddaughter stays with her most of the time. She does not drive. Daughter manages finances. She reports her mood is fine, daughter says "it is not fine, she has her days." She gets angry pretty quickly especially if she cannot find something. She was previously on citalopram which had helped, however Lori Wall reports her prior doctor wanted her to stop it, unclear why. No hallucinations or wandering behavior. Sleep is good. She denies any headaches,  dizziness, no falls. Lori Wall reports "she keeps moving her body."   Laboratory Data:  Lab Results  Component Value Date   TSH 1.51 02/25/2018   Lab Results  Component Value Date   VITAMINB12 455 02/25/2018     History on Initial Assessment 02/25/2018: This is a pleasant 58 year old right-handed woman with a history of hypertension, sleep apnea, stroke in 2008 with residual mild right-sided weakness, presenting for evaluation of forgetfulness. She is a poor historian and mostly says that she is forgetful. Her daughter provides majority of the history. Her daughter reports that she had a seizure first in 2008 then had the stroke affecting her right side and speech. She was on life support for a week. [place] that time, she had to start all over again, learning to eat, talk, and walk. Her daughter reports the stroke was due to alcohol abuse, she thinks there may have been a bleed (no prior records available). She moved in with her daughter after the stroke and has not been drinking any alcohol since 2008. Her daughter reports "brain damage" from the stroke with some memory issues, that have worsened over the past 3-4 months where family would ask her to do something and she would do something else. For instance, they would ask her to get the trash and she would go to the sink instead. Family has also noticed that she is more off balance, no falls. The patient states "I keep forgetting." She misplaces her glasses frequently and says her daughter moved it. She has left the faucet running. She denies leaving the stove  on. Her daughter has been managing finances and medications since the stroke. She does not drive. She is noted to be looking off to a distance several times during the visit, sometimes with a brief lag in response but answers questions appropriate after. Her daughter reports she does this at home sometimes, no unresponsive episodes. No further seizures since 2008.  She has had headaches since the stroke and was previously seen in a neurology practice in [place] in 2016 where she had an MRI brain reporting progressive atrophy and white matter disease since 2010. 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 This abnormality was not present in 2010. She had a follow-up CTA reporting distal right vertebral artery is patent but non dominant. This was suspected to be a normal anatomic variation rather than related to right vertebral artery stenosis in the neck. Headaches occur every other day with pressure in the left frontal region, no associated nausea/vomiting. She is sensitive to bright lights. She has been taking verapamil for HTN and headache prevention. She has been taking oxycodone BID for headaches since the stroke, no new changes in doses. She has occasional dizziness with spinning sensation when standing. No diplopia, dysphagia, neck/back pain, focal numbness/tingling, bowel/bladder dysfunction. No family history of dementia. No history of significant injuries. Mood is up and down, there is note of citalopram being started last month, but they deny taking it. Her daughter reports she has "always been tender-hearted." Daughter notes she is restless and cannot stay still. No paranoia or hallucinations. Sleep is good, she yells in her sleep. They report her mother passed away last year and this has been very hard on her.     Observations/Objective:  Limited due to nature of phone visit. Patient is awake, alert, able to answer questions without dysarthria. She is oriented x 3, reduced fluency, difficulty with repetition. Remote and recent memory impaired. Montreal Cognitive Assessment Blind 10/08/2018   Attention: Read list of digits (0/2) 1   Attention: Read list of letters (0/1) 1   Attention: Serial 7 subtraction starting at 100 (0/3) 1   Language: Repeat phrase (0/2) 1   Language : Fluency (0/1)  0   Abstraction (0/2) 0   Delayed Recall (0/5) 2   Orientation (0/6) 5   Total 11   Adjusted Score (based on education) 12      Assessment and Plan:   This is a pleasant 58 yo RH woman with a history of hypertension, stroke in 2008, with vascular dementia with behavioral disturbance. MOCA blind (done over phone) today 12/22 (MOCA 15/30 in Feb 2020). Continue Donepezil 10mg  daily. We again discussed mood changes that occur with dementia, it appears citalopram was helping with mood but it was stopped for unclear reasons. She has a new PCP, her daughter will call back for information so we can send records. She does not drive. Continue close supervision. Follow-up in 6 months, they know to call for any changes.    Follow Up Instructions:   -I discussed the assessment and treatment plan with the patient. The patient was provided an opportunity to ask questions and all were answered. The patient agreed with the plan and demonstrated an understanding of the instructions.   The patient was advised to call back or seek an in-person evaluation if the symptoms worsen or if the condition fails to improve as anticipated.    Total Time spent in visit with the patient was:  60  minutes, of which 100% of the time was spent in counseling and/or coordinating care on the above.   Pt understands and agrees with the plan of care outlined.     Cameron Sprang, MD

## 2018-10-21 ENCOUNTER — Telehealth: Payer: Self-pay | Admitting: Neurology

## 2018-10-21 MED ORDER — DONEPEZIL HCL 10 MG PO TABS: ORAL_TABLET | ORAL | 3 refills | Status: DC

## 2018-10-21 NOTE — Telephone Encounter (Signed)
The pt will need to sign a release stating that it is ok to send OV notes to Dr. Coralie Common office.

## 2018-10-21 NOTE — Telephone Encounter (Signed)
Called patients daughter to schedule F/U- that has been done but she said she still hasn't heard from anyone regarding the MRI to schedule. Can you give her an update on this? Also, she should be calling back to give the name of the PCP. She didn't have that info at time of the call. Thanks!

## 2018-10-21 NOTE — Telephone Encounter (Signed)
Patient's daughter called to report the patient is now seeing Dr. Bernita Buffy at [facility]. She'd like a note sent to Dr. Maida Sale about the patient starting on an antidepressant.  [place]

## 2018-10-22 NOTE — Telephone Encounter (Signed)
Called and spoke with patient's daughter, Malachy Mood. She stated Dr. Bernita Buffy is the patient's new PCP at the same office that referred the patient. She requested the notes be send for continuity of care related to the initial referral.   Fax for [facility]: [phone]

## 2018-10-22 NOTE — Telephone Encounter (Signed)
Routed office note to PCP via Epic

## 2018-12-09 ENCOUNTER — Telehealth: Payer: Self-pay | Admitting: Neurology

## 2018-12-09 NOTE — Telephone Encounter (Signed)
Patient's daughter called regarding a referral for an MRI to be set up. She said they have waited a few months but have not heard from anyone to set it up. Please Call. Thanks

## 2018-12-10 ENCOUNTER — Other Ambulatory Visit: Payer: Self-pay

## 2018-12-10 ENCOUNTER — Telehealth: Payer: Self-pay

## 2018-12-10 DIAGNOSIS — R413 Other amnesia: Secondary | ICD-10-CM

## 2018-12-10 NOTE — Telephone Encounter (Signed)
At Oct appt pt wished to have imaging performed closer to home at [place] like the hospital never received order. Will send. Daughter informed. Instructed her to call us back next Wed if nothing has been scheduled.  Message sent to [facility] to get MRI precerted.  Imaging fax# [phone]

## 2018-12-10 NOTE — Telephone Encounter (Signed)
Error

## 2018-12-17 ENCOUNTER — Encounter: Payer: Self-pay | Admitting: Neurology

## 2018-12-19 ENCOUNTER — Telehealth: Payer: Self-pay | Admitting: Neurology

## 2018-12-19 NOTE — Telephone Encounter (Signed)
MRI brain without contrast done 12/15/2018 at [facility]:  No acute changes. Unchanged moderately advanced cerebral and cerebellar atrophy, unchanged periventricular white matter disease with involvement of corpus callosum, nonspecific with considerations including demyelinating dse, atypical chronic small vessel ischemia, vasculitis, prior infection/inflammation

## 2018-12-22 ENCOUNTER — Telehealth: Payer: Self-pay

## 2018-12-22 NOTE — Telephone Encounter (Signed)
Daughter Malachy Mood informed of MRI results and if needed pt should monitor BP, cholesterol, glucose

## 2018-12-22 NOTE — Telephone Encounter (Signed)
-----   Message from Cameron Sprang, MD sent at 12/19/2018  2:53 PM EST ----- Regarding: MRI result Pls let daughter know the MRI brain did not show any evidence of tumor, stroke, or bleed. No change from MRI brain done in 2016 with age-related changes and hardening of the small blood vessels seen in patients with conditions such as blood pressure and cholesterol issues, diabetes. Thanks

## 2019-05-26 ENCOUNTER — Ambulatory Visit: Payer: Medicare Other | Admitting: Neurology

## 2019-06-10 ENCOUNTER — Encounter: Payer: Self-pay | Admitting: Surgery

## 2019-06-10 ENCOUNTER — Ambulatory Visit (INDEPENDENT_AMBULATORY_CARE_PROVIDER_SITE_OTHER): Payer: Medicare Other

## 2019-06-10 ENCOUNTER — Ambulatory Visit (INDEPENDENT_AMBULATORY_CARE_PROVIDER_SITE_OTHER): Payer: Medicare Other | Admitting: Surgery

## 2019-06-10 ENCOUNTER — Other Ambulatory Visit: Payer: Self-pay

## 2019-06-10 DIAGNOSIS — M25531 Pain in right wrist: Secondary | ICD-10-CM | POA: Diagnosis not present

## 2019-06-10 DIAGNOSIS — M79641 Pain in right hand: Secondary | ICD-10-CM

## 2019-06-10 DIAGNOSIS — S62324A Displaced fracture of shaft of fourth metacarpal bone, right hand, initial encounter for closed fracture: Secondary | ICD-10-CM

## 2019-06-10 NOTE — Progress Notes (Signed)
Office Visit Note   Patient: Lori Wall           Date of Birth: 04-16-1960           MRN: [id number] Visit Date: 06/10/2019              Requested by: [facility] [address] [place],  [place] [address] PCP: [facility]   Assessment & Plan: Visit Diagnoses:  1. Pain in right wrist   2. Pain in right hand   3. Closed displaced fracture of shaft of fourth metacarpal bone of right hand, initial encounter     Plan: Reviewed x-rays with Dr. August Saucer today.  Patient was put in a ulnar gutter fiberglass cast.  Did advise her to keep her hand elevated as much as possible to help decrease swelling.  Follow with Dr. August Saucer in a couple weeks for recheck and repeat x-ray.  Follow-Up Instructions: Return in about 2 weeks (around 06/24/2019) for With Dr. August Saucer for recheck metacarpal fracture.   Orders:  Orders Placed This Encounter  Procedures   XR Wrist Complete Right   No orders of the defined types were placed in this encounter.     Procedures: No procedures performed   Clinical Data: No additional findings.   Subjective: Chief Complaint  Patient presents with   Right Wrist - Pain    HPI That is secondary black female comes in today with complaints of right wrist and hand pain.  Patient states that last Thursday she fell in her driveway on an outstretched hand.  On Friday went to an urgent care in [place] and x-rays were done.  Patient did not bring those x-rays into this visit.  She is accompanied by her daughter.  I did read the imaging report on the daughter's phone and that read that there was a fracture of the fourth metacarpal.  Degenerative changes of the wrist joint.  Patient was put in a removable thumb spica splint at the urgent care.  She denies any previous fracture to the right wrist. Review of Systems No current cardiac pulmonary GI GU issues  Objective: Vital Signs: There  were no vitals taken for this visit.  Physical Exam HENT:     Head: Normocephalic and atraumatic.  Eyes:     Extraocular Movements: Extraocular movements intact.     Pupils: Pupils are equal, round, and reactive to light.  Pulmonary:     Effort: No respiratory distress.  Musculoskeletal:     Comments: Patient has good range of motion of the wrist.  Wrist joint itself is mildly tender.  Does have some swelling from the wrist down to the hand.  She exquisitely tender over the fourth metacarpal.  Moves fingers well but with discomfort from the fracture.  Skin:    General: Skin is warm and dry.  Neurological:     General: No focal deficit present.     Mental Status: She is alert.  Psychiatric:        Mood and Affect: Mood normal.     Ortho Exam  Specialty Comments:  No specialty comments available.  Imaging: No results found.   PMFS History: There are no problems to display for this patient.  Past Medical History:  Diagnosis Date   COPD (chronic obstructive pulmonary disease) (HCC)    Hypertension    Sleep apnea    Stroke [facility])     History reviewed. No pertinent  family history.  History reviewed. No pertinent surgical history. Social History   Occupational History   Not on file  Tobacco Use   Smoking status: Unknown If Ever Smoked   Smokeless tobacco: Never Used  Substance and Sexual Activity   Alcohol use: Never   Drug use: Never   Sexual activity: Not Currently    Partners: Male

## 2019-06-24 ENCOUNTER — Ambulatory Visit (INDEPENDENT_AMBULATORY_CARE_PROVIDER_SITE_OTHER): Payer: Medicare Other | Admitting: Orthopedic Surgery

## 2019-06-24 ENCOUNTER — Ambulatory Visit (INDEPENDENT_AMBULATORY_CARE_PROVIDER_SITE_OTHER): Payer: Medicare Other

## 2019-06-24 DIAGNOSIS — S62324A Displaced fracture of shaft of fourth metacarpal bone, right hand, initial encounter for closed fracture: Secondary | ICD-10-CM

## 2019-06-25 ENCOUNTER — Encounter: Payer: Self-pay | Admitting: Orthopedic Surgery

## 2019-06-25 NOTE — Progress Notes (Signed)
Office Visit Note   Patient: Lori Wall           Date of Birth: 1960/06/24           MRN: [id number] Visit Date: 06/24/2019 Requested by: [facility] [address] [place],  [place] [address] PCP: [facility]  Subjective: Chief Complaint  Patient presents with  . Follow-up    Rt hand/wrist pain    HPI: Lori Wall is a 59 year old patient with right hand and right wrist pain.  She was diagnosed with metacarpal fracture last week.  She has been in a cast for a couple of weeks.  Date of injury 06/04/2019.  States her hand is feeling some better.  Still is having some pain in that hand region.  She is taking oxycodone from another provider.  Denies any other orthopedic complaints.              ROS: All systems reviewed are negative as they relate to the chief complaint within the history of present illness.  Patient denies  fevers or chills.   Assessment & Plan: Visit Diagnoses:  1. Closed displaced fracture of shaft of fourth metacarpal bone of right hand, initial encounter     Plan: Impression is slightly angulated fourth metacarpal fracture with no real rotational deformity.  Not much callus formation around the fracture on radiographs today and she does have some tenderness.  Like to put her back into a short arm cast and then stay in that cast for about 10 more days which will get her out to the 3-1/2 to 4-week time..  Come back in approximately 10 days and we will remove the cast and let her go into a removable wrist splint for more active type of activities.  Essentially needs clinical assessment at that time and assessment of rotational alignment.  Following that visit she should come back in about 2 or 3 weeks after that for final check.  Follow-Up Instructions: No follow-ups on file.   Orders:  Orders Placed This Encounter  Procedures  . XR Hand Complete Right   No orders of the defined  types were placed in this encounter.     Procedures: No procedures performed   Clinical Data: No additional findings.  Objective: Vital Signs: There were no vitals taken for this visit.  Physical Exam:   Constitutional: Patient appears well-developed HEENT:  Head: Normocephalic Eyes:EOM are normal Neck: Normal range of motion Cardiovascular: Normal rate Pulmonary/chest: Effort normal Neurologic: Patient is alert Skin: Skin is warm Psychiatric: Patient has normal mood and affect    Ortho Exam: Ortho exam demonstrates no rotational deformity in that right hand of finger flexion 2 through 5.  Mild swelling and slight tenderness is present around the fourth metacarpal.  No visible deformity is present and the swelling has diminished.  Wrist is nontender to palpation.  Radial pulses intact.  Specialty Comments:  No specialty comments available.  Imaging: No results found.   PMFS History: There are no problems to display for this patient.  Past Medical History:  Diagnosis Date  . COPD (chronic obstructive pulmonary disease) (HCC)   . Hypertension   . Sleep apnea   . Stroke [facility])     History reviewed. No pertinent family history.  History reviewed. No pertinent surgical history. Social History   Occupational History  . Not on file  Tobacco Use  . Smoking status: Unknown If Ever Smoked  .  Smokeless tobacco: Never Used  Vaping Use  . Vaping Use: Never used  Substance and Sexual Activity  . Alcohol use: Never  . Drug use: Never  . Sexual activity: Not Currently    Partners: Male

## 2019-07-08 ENCOUNTER — Ambulatory Visit (INDEPENDENT_AMBULATORY_CARE_PROVIDER_SITE_OTHER): Payer: Medicare Other | Admitting: Orthopedic Surgery

## 2019-07-08 ENCOUNTER — Ambulatory Visit (INDEPENDENT_AMBULATORY_CARE_PROVIDER_SITE_OTHER): Payer: Medicare Other

## 2019-07-08 DIAGNOSIS — S62324A Displaced fracture of shaft of fourth metacarpal bone, right hand, initial encounter for closed fracture: Secondary | ICD-10-CM

## 2019-07-11 ENCOUNTER — Encounter: Payer: Self-pay | Admitting: Orthopedic Surgery

## 2019-07-11 NOTE — Progress Notes (Signed)
   Post-Op Visit Note   Patient: Lori Wall           Date of Birth: 06/14/1960           MRN: [id number] Visit Date: 07/08/2019 PCP: [facility]   Assessment & Plan:  Chief Complaint:  Chief Complaint  Patient presents with  . Right Hand - Follow-up, Fracture   Visit Diagnoses:  1. Closed displaced fracture of shaft of fourth metacarpal bone of right hand, initial encounter     Plan: [name] is a patient with fourth metacarpal fracture.  Cast is removed.  She has minimal pain to palpation of the fracture site and no rotational deformity of the fourth finger when she makes a fist.  Radiographs show good callus formation.  Plan is removable splint for 2 weeks then activity as tolerated follow-up as needed  Follow-Up Instructions: Return if symptoms worsen or fail to improve.   Orders:  Orders Placed This Encounter  Procedures  . XR Hand Complete Right   No orders of the defined types were placed in this encounter.   Imaging: No results found.  PMFS History: There are no problems to display for this patient.  Past Medical History:  Diagnosis Date  . COPD (chronic obstructive pulmonary disease) (HCC)   . Hypertension   . Sleep apnea   . Stroke [facility])     History reviewed. No pertinent family history.  History reviewed. No pertinent surgical history. Social History   Occupational History  . Not on file  Tobacco Use  . Smoking status: Unknown If Ever Smoked  . Smokeless tobacco: Never Used  Vaping Use  . Vaping Use: Never used  Substance and Sexual Activity  . Alcohol use: Never  . Drug use: Never  . Sexual activity: Not Currently    Partners: Male

## 2020-12-21 ENCOUNTER — Ambulatory Visit: Payer: Medicare Other | Admitting: Orthopedic Surgery

## 2021-05-26 ENCOUNTER — Ambulatory Visit (INDEPENDENT_AMBULATORY_CARE_PROVIDER_SITE_OTHER): Payer: Medicare Other | Admitting: Neurology

## 2021-05-26 ENCOUNTER — Encounter: Payer: Self-pay | Admitting: Neurology

## 2021-05-26 VITALS — BP 131/85 | HR 70 | Ht 66.0 in | Wt 216.0 lb

## 2021-05-26 DIAGNOSIS — F0153 Vascular dementia, unspecified severity, with mood disturbance: Secondary | ICD-10-CM | POA: Diagnosis not present

## 2021-05-26 MED ORDER — DONEPEZIL HCL 10 MG PO TABS: ORAL_TABLET | ORAL | 3 refills | Status: DC

## 2021-05-26 NOTE — Patient Instructions (Signed)
Good to see you.  Restart Donepezil 10mg  daily  2. If mood changes continue, discuss mood medication with PCP  3. Follow-up in 6 months, call for any changes   FALL PRECAUTIONS: Be cautious when walking. Scan the area for obstacles that may increase the risk of trips and falls. When getting up in the mornings, sit up at the edge of the bed for a few minutes before getting out of bed. Consider elevating the bed at the head end to avoid drop of blood pressure when getting up. Walk always in a well-lit room (use night lights in the walls). Avoid area rugs or power cords from appliances in the middle of the walkways. Use a walker or a cane if necessary and consider physical therapy for balance exercise. Get your eyesight checked regularly.  FINANCIAL OVERSIGHT: Supervision, especially oversight when making financial decisions or transactions is also recommended.  HOME SAFETY: Consider the safety of the kitchen when operating appliances like stoves, microwave oven, and blender. Consider having supervision and share cooking responsibilities until no longer able to participate in those. Accidents with firearms and other hazards in the house should be identified and addressed as well.  DRIVING: Regarding driving, in patients with progressive memory problems, driving will be impaired. We advise to have someone else do the driving if trouble finding directions or if minor accidents are reported. Independent driving assessment is available to determine safety of driving.  ABILITY TO BE LEFT ALONE: If patient is unable to contact 911 operator, consider using LifeLine, or when the need is there, arrange for someone to stay with patients. Smoking is a fire hazard, consider supervision or cessation. Risk of wandering should be assessed by caregiver and if detected at any point, supervision and safe proof recommendations should be instituted.  MEDICATION SUPERVISION: Inability to self-administer medication needs  to be constantly addressed. Implement a mechanism to ensure safe administration of the medications.  RECOMMENDATIONS FOR ALL PATIENTS WITH MEMORY PROBLEMS: 1. Continue to exercise (Recommend 30 minutes of walking everyday, or 3 hours every week) 2. Increase social interactions - continue going to [place] and enjoy social gatherings with friends and family 3. Eat healthy, avoid fried foods and eat more fruits and vegetables 4. Maintain adequate blood pressure, blood sugar, and blood cholesterol level. Reducing the risk of stroke and cardiovascular disease also helps promoting better memory. 5. Avoid stressful situations. Live a simple life and avoid aggravations. Organize your time and prepare for the next day in anticipation. 6. Sleep well, avoid any interruptions of sleep and avoid any distractions in the bedroom that may interfere with adequate sleep quality 7. Avoid sugar, avoid sweets as there is a strong link between excessive sugar intake, diabetes, and cognitive impairment The Mediterranean diet has been shown to help patients reduce the risk of progressive memory disorders and reduces cardiovascular risk. This includes eating fish, eat fruits and green leafy vegetables, nuts like almonds and hazelnuts, walnuts, and also use olive oil. Avoid fast foods and fried foods as much as possible. Avoid sweets and sugar as sugar use has been linked to worsening of memory function.  There is always a concern of gradual progression of memory problems. If this is the case, then we may need to adjust level of care according to patient needs. Support, both to the patient and caregiver, should then be put into place.

## 2021-05-26 NOTE — Progress Notes (Signed)
NEUROLOGY FOLLOW UP OFFICE NOTE  Lori Wall [place]wick [id number] 12/30/1960  HISTORY OF PRESENT ILLNESS: I had the pleasure of seeing Lori Wall Hughett in follow-up in the neurology clinic on 05/26/2021.  The patient was last seen almost over 2 years ago for vascular dementia. She is again accompanied by her daughter Lori Wall who helps supplement the history today.  Records and images were personally reviewed where available. MOCA blind (done over phone) in 10/2018 was 12/22 (15/30 in 02/2018). MRI in 2020 showed moderately advanced cerebral and cerebellar atrophy, chronic microvascular disease. At that time she was on Donepezil 10mg  daily. Patient called for an appointment due to worsening memory and falls.  She feels her memory "ain't good." Lori Wall reports memory is "terrible," she would put something down and "goes off the wall" thinking someone moved it. She does not wander at night but does not sleep well, getting up and fussing on who moved her belt. She dozes off during the day. She has been off Donepezil for a year due to being lost to follow-up and running out of medication. Lori Wall did notice a change once off the medication. Family manages medications, finances, meals. She does not drive. She needs assistance with dressing and bathing. She does puzzle books at home. She has difficulty exercising due to left hip/knee pain. Last fall was 3 months ago. She admits to getting angry easily sometimes, Lori Wall denies any hallucinations. She denies any headaches, focal numbness/tingling/weakness, neck/back pain, bowel/bladder dysfunction. She denies dizziness, however Lori Wall reports she gets dizzy when standing. She lives with her daughter and great granddaughter.    History on Initial Assessment 02/25/2018: This is a pleasant 61 year old right-handed woman with a history of hypertension, sleep apnea, stroke in 2008 with residual mild right-sided weakness, presenting for evaluation of forgetfulness. She is a poor  historian and mostly says that she is forgetful. Her daughter provides majority of the history. Her daughter reports that she had a seizure first in 2008 then had the stroke affecting her right side and speech. She was on life support for a week. [place] time, she had to start all over again, learning to eat, talk, and walk. Her daughter reports the stroke was due to alcohol abuse, she thinks there may have been a bleed (no prior records available). She moved in with her daughter after the stroke and has not been drinking any alcohol since 2008. Her daughter reports "brain damage" from the stroke with some memory issues, that have worsened over the past 3-4 months where family would ask her to do something and she would do something else. For instance, they would ask her to get the trash and she would go to the sink instead. Family has also noticed that she is more off balance, no falls. The patient states "I keep forgetting." She misplaces her glasses frequently and says her daughter moved it. She has left the faucet running. She denies leaving the stove on. Her daughter has been managing finances and medications since the stroke. She does not drive. She is noted to be looking off to a distance several times during the visit, sometimes with a brief lag in response but answers questions appropriate after. Her daughter reports she does this at home sometimes, no unresponsive episodes. No further seizures since 2008. She has had headaches since the stroke and was previously seen in a neurology practice in [place] in 2016 where she had an MRI brain reporting progressive atrophy and  white matter disease since 2010. 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 This abnormality was not present in 2010. She had a follow-up CTA reporting distal right vertebral artery is patent but non dominant. This was suspected to be a  normal anatomic variation rather than related to right vertebral artery stenosis in the neck. Headaches occur every other day with pressure in the left frontal region, no associated nausea/vomiting. She is sensitive to bright lights. She has been taking verapamil for HTN and headache prevention. She has been taking oxycodone BID for headaches since the stroke, no new changes in doses. She has occasional dizziness with spinning sensation when standing. No diplopia, dysphagia, neck/back pain, focal numbness/tingling, bowel/bladder dysfunction. No family history of dementia. No history of significant injuries. Mood is up and down, there is note of citalopram being started last month, but they deny taking it. Her daughter reports she has "always been tender-hearted." Daughter notes she is restless and cannot stay still. No paranoia or hallucinations. Sleep is good, she yells in her sleep. They report her mother passed away last year and this has been very hard on her.    PAST MEDICAL HISTORY: Past Medical History:  Diagnosis Date   COPD (chronic obstructive pulmonary disease) (HCC)    Hypertension    Sleep apnea    Stroke [facility])     MEDICATIONS: Current Outpatient Medications on File Prior to Visit  Medication Sig Dispense Refill   donepezil (ARICEPT) 10 MG tablet Take 1 tablet daily 90 tablet 3   Magnesium Oxide 250 MG TABS Take by mouth.     Oxycodone HCl 10 MG TABS Take 10 mg by mouth 3 (three) times daily.     No current facility-administered medications on file prior to visit.    ALLERGIES: No Known Allergies  FAMILY HISTORY: Family History  Problem Relation Age of Onset   Cancer Mother    Cancer Father     SOCIAL HISTORY: Social History   Socioeconomic History   Marital status: Single    Spouse name: Not on file   Number of children: Not on file   Years of education: Not on file   Highest education level: Not on file  Occupational History   Not on file  Tobacco Use    Smoking status: Unknown   Smokeless tobacco: Never  Vaping Use   Vaping Use: Never used  Substance and Sexual Activity   Alcohol use: Never   Drug use: Never   Sexual activity: Not Currently    Partners: Male  Other Topics Concern   Not on file  Social History Narrative   Pt is right handed   Lives in 1 story home with daughter and granddaughter   Has 3 adult children   10th grade education   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 Year is 2004. No aphasia or dysarthria. Fund of knowledge  is appropriate.  Attention and concentration are normal. Difficulty with intersecting pentagons. MMSE 21/30.    05/26/2021    8:00 AM  MMSE - Mini Mental State Exam  Orientation to time 3  Orientation to Place 3  Registration 3  Attention/ Calculation 3  Recall 2  Language- name 2 objects 2  Language- repeat 1  Language- follow 3 step command 2  Language- read & follow direction 1  Write a sentence 1  Copy design 0  Total score 21   Cranial nerves: Pupils equal, round. Extraocular movements intact with no nystagmus. Visual fields full.  No facial asymmetry.  Motor: Bulk and tone normal, no cogwheeling, muscle strength 5/5 throughout with no pronator drift. Reflexes +2 throughout.   Finger to nose testing intact.  Gait slow and cautious favoring left leg, no ataxia   IMPRESSION: This is a pleasant 61 yo RH woman with a history of hypertension, stroke in 2008,  with vascular dementia with behavioral disturbance. MMSE today 21/30. She has been off Donepezil for a year, daughter notes memory changes have become more apparent. Restart Donepezil 10mg  daily. We discussed mood changes that occur with memory changes, if no improvement with Donepezil, can discuss starting an SSRI with PCP. Discussed the importance of control of vascular risk factors, physical exercise, brain stimulation exercises, MIND diet for overall brain health. Continue close supervision. Follow-up with [facility] in 6 months, call for any changes.   Thank you for allowing me to participate in her care.  Please do not hesitate to call for any questions or concerns.    Marlowe Kays, M.D.   CC: [facility], [facility]

## 2021-11-29 ENCOUNTER — Ambulatory Visit: Payer: Medicare Other | Admitting: Physician Assistant

## 2021-11-29 ENCOUNTER — Encounter: Payer: Self-pay | Admitting: Physician Assistant

## 2021-11-29 DIAGNOSIS — Z029 Encounter for administrative examinations, unspecified: Secondary | ICD-10-CM

## 2021-11-29 NOTE — Progress Notes (Incomplete)
Assessment/Plan:   Dementia likely due to  Lori Wall is a very pleasant 61 y.o. RH female seen today in follow up for memory loss. Patient is currently on donepezil 10 mg daily, tolerating well. MRI brain personally reviewed was remarkablwith a history of hypertension, stroke in 2008, with vascular dementia with behavioral disturbance e for       Follow up in   months. Continue donepezil 10 mg daily Continue to control mood as per PCP Continue to control cardiovascular risk factors    Subjective:    This patient is accompanied in the office by his daughter*** who supplements the history.  Previous records as well as any outside records available were reviewed prior to todays visit.  He was last seen on 05/26/2021 at which time his MMSE was 21/30.   Any changes in memory since last visit?  He is alert reports that his memory is "terrible".  She continues to do puzzles at home. Repeats oneself?  Endorsed Disoriented when walking into a room?  Patient denies   Leaving objects in unusual places?  Patient denies   Ambulates  with difficulty?   She has chronic left hip and knee pain which limits her mobility. Recent falls?  Patient denies   Any head injuries?  Patient denies   History of seizures?   Patient denies   Wandering behavior?  Patient denies   Patient drives?   Patient no longer drives  Any mood changes since last visit?  Admits to getting angry sometimes. Any worsening depression?:  Patient denies   Hallucinations?  Patient denies   Paranoia?  She gets agitated if somebody touches her belongings, accusing people of moving objects*** Patient reports that she does not sleep well without vivid dreams, REM behavior or sleepwalking, then she has increased somnolence during the day. History of sleep apnea?  Patient denies   Any hygiene concerns?  Patient denies   Independent of bathing and dressing?  Endorsed  Does the patient needs help with medications?  Family in charge  *** Who is in charge of the finances?  Family is in charge  *** Any changes in appetite?  Patient denies  *** Patient have trouble swallowing? Patient denies   Does the patient cook?  Family is in charge Any headaches?  Patient denies   Double vision? Patient denies   Any focal numbness or tingling?  Patient denies   Chronic back pain Patient denies   Unilateral weakness?  Patient denies   Any tremors?  Patient denies   Any history of anosmia?  Patient denies   Any incontinence of urine?  Patient denies   Any bowel dysfunction?   Patient denies      Patient lives with: Her daughter and granddaughter    History on Initial Assessment 02/25/2018: This is a pleasant 61 year old right-handed woman with a history of hypertension, sleep apnea, stroke in 2008 with residual mild right-sided weakness, presenting for evaluation of forgetfulness. She is a poor historian and mostly says that she is forgetful. Her daughter provides majority of the history. Her daughter reports that she had a seizure first in 2008 then had the stroke affecting her right side and speech. She was on life support for a week. She was [place] in [place] at that time, she had to start all over again, learning to eat, talk, and walk. Her daughter reports the stroke was due to alcohol abuse, she thinks there may have been a bleed (no prior records  available). She moved in with her daughter after the stroke and has not been drinking any alcohol since 2008. Her daughter reports "brain damage" from the stroke with some memory issues, that have worsened over the past 3-4 months where family would ask her to do something and she would do something else. For instance, they would ask her to get the trash and she would go to the sink instead. Family has also noticed that she is more off balance, no falls. The patient states "I keep forgetting." She misplaces her glasses frequently and says her daughter moved it. She has left the faucet  running. She denies leaving the stove on. Her daughter has been managing finances and medications since the stroke. She does not drive. She is noted to be looking off to a distance several times during the visit, sometimes with a brief lag in response but answers questions appropriate after. Her daughter reports she does this at home sometimes, no unresponsive episodes. No further seizures since 2008. She has had headaches since the stroke and was previously seen in a neurology practice in [place] in 2016 where she had an MRI brain reporting progressive atrophy and white matter disease since 2010. 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 This abnormality was not present in 2010. She had a follow-up CTA reporting distal right vertebral artery is patent but non dominant. This was suspected to be a normal anatomic variation rather than related to right vertebral artery stenosis in the neck. Headaches occur every other day with pressure in the left frontal region, no associated nausea/vomiting. She is sensitive to bright lights. She has been taking verapamil for HTN and headache prevention. She has been taking oxycodone BID for headaches since the stroke, no new changes in doses. She has occasional dizziness with spinning sensation when standing. No diplopia, dysphagia, neck/back pain, focal numbness/tingling, bowel/bladder dysfunction. No family history of dementia. No history of significant injuries. Mood is up and down, there is note of citalopram being started last month, but they deny taking it. Her daughter reports she has "always been tender-hearted." Daughter notes she is restless and cannot stay still. No paranoia or hallucinations. Sleep is good, she yells in her sleep. They report her mother passed away last year and this has been very hard on her.   PREVIOUS MEDICATIONS:   CURRENT MEDICATIONS:  Outpatient Encounter Medications as of  11/29/2021  Medication Sig   donepezil (ARICEPT) 10 MG tablet Take 1 tablet daily   Magnesium Oxide 250 MG TABS Take by mouth. (Patient not taking: Reported on 05/26/2021)   Meloxicam 10 MG CAPS Take by mouth. One tablet daily as needed   Oxycodone HCl 10 MG TABS Take 10 mg by mouth 3 (three) times daily.   No facility-administered encounter medications on file as of 11/29/2021.       05/26/2021    8:00 AM  MMSE - Mini Mental State Exam  Orientation to time 3  Orientation to Place 3  Registration 3  Attention/ Calculation 3  Recall 2  Language- name 2 objects 2  Language- repeat 1  Language- follow 3 step command 2  Language- read & follow direction 1  Write a sentence 1  Copy design 0  Total score 21      10/08/2018   11:00 AM 02/25/2018    9:00 AM  Montreal Cognitive Assessment   Visuospatial/ Executive (0/5)  3  Naming (0/3)  1  Attention: Read list of digits (0/2) 1 2  Attention: Read list of letters (0/1) 1 0  Attention: Serial 7 subtraction starting at 100 (0/3) 1 0  Language: Repeat phrase (0/2) 1 0  Language : Fluency (0/1) 0 0  Abstraction (0/2) 0 0  Delayed Recall (0/5) 2 3  Orientation (0/6) 5 5  Total  14  Adjusted Score (based on education)  15    Objective:     PHYSICAL EXAMINATION:    VITALS:  There were no vitals filed for this visit.  GEN:  The patient appears stated age and is in NAD. HEENT:  Normocephalic, atraumatic.   Neurological examination:  General: NAD, well-groomed, appears stated age. Orientation: The patient is alert. Oriented to person, place and not to date Cranial nerves: There is good facial symmetry.The speech is fluent and clear. No aphasia or dysarthria. Fund of knowledge is appropriate. Recent and remote memory are impaired. Attention and concentration are reduced.  Able to name objects and repeat phrases.  Hearing is intact to conversational tone.    Sensation: Sensation is intact to light touch throughout Motor: Strength  is at least antigravity x4. Tremors: none  DTR's 2/4 in UE/LE     Movement examination: Tone: There is normal tone in the UE/LE Abnormal movements:  no tremor.  No myoclonus.  No asterixis.   Coordination:  There is no decremation with RAM's. Normal finger to nose  Gait and Station: The patient has no difficulty arising out of a deep-seated chair without the use of the hands. The patient's stride length is good.  Gait is cautious and narrow, favors the left leg, no ataxia.    Thank you for allowing [place] the opportunity to participate in the care of this nice patient. Please do not hesitate to contact us for any questions or concerns.   Total time spent on today's visit was *** minutes dedicated to this patient today, preparing to see patient, examining the patient, ordering tests and/or medications and counseling the patient, documenting clinical information in the EHR or other health record, independently interpreting results and communicating results to the patient/family, discussing treatment and goals, answering patient's questions and coordinating care.  Cc:  [facility]  Marlowe Kays 11/29/2021 6:45 AM

## 2021-12-20 ENCOUNTER — Encounter: Payer: Self-pay | Admitting: Physician Assistant

## 2021-12-20 ENCOUNTER — Telehealth (INDEPENDENT_AMBULATORY_CARE_PROVIDER_SITE_OTHER): Payer: Medicare Other | Admitting: Physician Assistant

## 2021-12-20 ENCOUNTER — Ambulatory Visit: Payer: Medicare Other | Admitting: Physician Assistant

## 2021-12-20 DIAGNOSIS — R413 Other amnesia: Secondary | ICD-10-CM | POA: Diagnosis not present

## 2021-12-20 MED ORDER — MEMANTINE HCL 5 MG PO TABS: ORAL_TABLET | ORAL | 11 refills | Status: DC

## 2021-12-20 NOTE — Progress Notes (Signed)
Virtual Visit via Video Note The purpose of this virtual visit is to provide medical care in a patient that is unable to be seen in person due to physical or health limitations   Consent was obtained for video visit:  yes  Answered questions that patient had about telehealth interaction:  yes I discussed the limitations, risks, security and privacy concerns of performing an evaluation and management service by telemedicine. I also discussed with the patient that there may be a patient responsible charge related to this service. The patient expressed understanding and agreed to proceed.  Pt location: Home Physician Location: office Name of referring provider:  [facility] I connected with Lori Wall at patients initiation/request on 12/20/2021 at  9:00 AM EST by video enabled telemedicine application and verified that I am speaking with the correct person using two identifiers. Pt MRN:  [id number] Pt DOB:  1960/07/11 Video Participants:  Lori Wall;  Daughter  Previous records as well as any outside records available were reviewed prior to todays visit.   Assessment and Plan:    Lori Wall  is a 61 y.o. RH female with a history of  hypertension, stroke in 2008, memory impairment likely due to vascular dementia with behavioral disturbance seen today in follow up for memory loss. In review, she had lost to follow up after initial evaluation on 10/2018 at which time her MoCA blind was 12/22 . MRI in 2020 showed moderately advanced cerebral and cerebellar atrophy, chronic microvascular disease, she was on donepezil 10 mg for a time, then discontinuing the medicine. She then resumed follow up after on 05/2021 after reporting worsening of memory and falls, re-placed on donepezil 10 mg daily. MMSE at the time was 21/30.  In today's virtual visit, her delayed recall was 2/3, unable to tell time, write a sentence or copy a design. Unable to complete MMSE as this was done  virtually.   Follow up in 6  months. Continue donepezil 10 mg daily, side effects discussed Add memantine 5 mg at night for 2 weeks then increase to 5 mg bid if tolerated, side effects discussed  Continue to control mood as per PCP Recommend good control of cardiovascular risk factors  Subjective   Any changes in memory since last visit?  She denies but daughter reports that patient"forgets stuff, loses stuff, can't find it and gets upset and then blames someone for it". "She has a hard time remembering names of people and conversations"-daughter says.   repeats oneself?  "Not all the time Disoriented when walking into a room?  Denies Leaving objects in unusual places?  Patient denies   Wandering behavior?  Patient denies   Any personality changes since last visit?  Patient denies, but she may get angry at times Any worsening depression?:  Patient denies   Hallucinations or paranoia?  "She talks to her deceased mother, and to the pigeon in the wall "(she did show me the picture of the pigeon on  the wall that talks to her during this visit) Seizures?   Patient denies    Any sleep changes?  "Sometimes because of the Right knee pain" . Denies vivid dreams, REM behavior or sleepwalking   Sleep apnea?" Used to, but not now " Any hygiene concerns?  Patient denies   Independent of bathing and dressing?  Needs assistance Does the patient needs help with medications? Aide is in charge  Who is in charge of the finances?  Daughter  is  in charge    Any changes in appetite?  "She eats!".-The daughter says Patient have trouble swallowing? Patient denies   Does the patient cook? Sometimes, with supervision    Any kitchen accidents such as leaving the stove on? Patient denies   Any headaches?  Patient denies   Chronic back pain Patient denies   Ambulates with difficulty?   "She always walks with a little limp, but otherwise she uses a walker"  Recent falls or head injuries?  Patient denies      Unilateral weakness, numbness or tingling?  Patient denies   Any tremors?  Patient denies   Any anosmia?  Patient denies   Any incontinence of urine?  Patient denies   Any bowel dysfunction?   Patient denies      Patient lives  with daughter and her granddaughter  Does the patient drive? Daughter does the driving  No recent Covid or hospitalizations  No dizziness    History on Initial Assessment 02/25/2018: This is a pleasant 61 year old right-handed woman with a history of hypertension, sleep apnea, stroke in 2008 with residual mild right-sided weakness, presenting for evaluation of forgetfulness. She is a poor historian and mostly says that she is forgetful. Her daughter provides majority of the history. Her daughter reports that she had a seizure first in 2008 then had the stroke affecting her right side and speech. She was on life support for a week. She was living in [place] county at that time, she had to start all over again, learning to eat, talk, and walk. Her daughter reports the stroke was due to alcohol abuse, she thinks there may have been a bleed (no prior records available). She moved in with her daughter after the stroke and has not been drinking any alcohol since 2008. Her daughter reports "brain damage" from the stroke with some memory issues, that have worsened over the past 3-4 months where family would ask her to do something and she would do something else. For instance, they would ask her to get the trash and she would go to the sink instead. Family has also noticed that she is more off balance, no falls. The patient states "I keep forgetting." She misplaces her glasses frequently and says her daughter moved it. She has left the faucet running. She denies leaving the stove on. Her daughter has been managing finances and medications since the stroke. She does not drive. She is noted to be looking off to a distance several times during the visit, sometimes with a brief lag in  response but answers questions appropriate after. Her daughter reports she does this at home sometimes, no unresponsive episodes. No further seizures since 2008. She has had headaches since the stroke and was previously seen in a neurology practice in [place] in 2016 where she had an MRI brain reporting progressive atrophy and white matter disease since 2010. 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 This abnormality was not present in 2010. She had a follow-up CTA reporting distal right vertebral artery is patent but non dominant. This was suspected to be a normal anatomic variation rather than related to right vertebral artery stenosis in the neck. Headaches occur every other day with pressure in the left frontal region, no associated nausea/vomiting. She is sensitive to bright lights. She has been taking verapamil for HTN and headache prevention. She has been taking oxycodone BID for headaches since the stroke, no new  changes in doses. She has occasional dizziness with spinning sensation when standing. No diplopia, dysphagia, neck/back pain, focal numbness/tingling, bowel/bladder dysfunction. No family history of dementia. No history of significant injuries. Mood is up and down, there is note of citalopram being started last month, but they deny taking it. Her daughter reports she has "always been tender-hearted." Daughter notes she is restless and cannot stay still. No paranoia or hallucinations. Sleep is good, she yells in her sleep. They report her mother passed away last year and this has been very hard on her.        Current Outpatient Medications on File Prior to Visit  Medication Sig Dispense Refill   donepezil (ARICEPT) 10 MG tablet Take 1 tablet daily 90 tablet 3   Meloxicam 10 MG CAPS Take by mouth. One tablet daily as needed     Oxycodone HCl 10 MG TABS Take 10 mg by mouth 3 (three) times daily.     Magnesium Oxide 250 MG  TABS Take by mouth. (Patient not taking: Reported on 05/26/2021)     No current facility-administered medications on file prior to visit.     Observations/Objective:   There were no vitals filed for this visit. GEN:  The patient appears stated age and is in NAD.  Neurological examination: Patient is awake, alert, oriented x 3. Year is 2004. No aphasia or dysarthria Fund of knowledge is appropriate.  Remote and recent memory intact. Able to name and repeat. Cranial nerves: Extraocular movements intact with no nystagmus. No facial asymmetry. Motor: moves all extremities symmetrically, at least anti-gravity x 4. No incoordination on finger to nose testing. Gait: narrow-based and steady, able to tandem walk adequately.      10/08/2018   11:00 AM 02/25/2018    9:00 AM  Montreal Cognitive Assessment   Visuospatial/ Executive (0/5)  3  Naming (0/3)  1  Attention: Read list of digits (0/2) 1 2  Attention: Read list of letters (0/1) 1 0  Attention: Serial 7 subtraction starting at 100 (0/3) 1 0  Language: Repeat phrase (0/2) 1 0  Language : Fluency (0/1) 0 0  Abstraction (0/2) 0 0  Delayed Recall (0/5) 2 3  Orientation (0/6) 5 5  Total  14  Adjusted Score (based on education)  15       12/20/2021    9:00 AM 05/26/2021    8:00 AM  MMSE - Mini Mental State Exam  Not completed: Unable to complete   Orientation to time  3  Orientation to Place  3  Registration  3  Attention/ Calculation  3  Recall  2  Language- name 2 objects  2  Language- repeat  1  Language- follow 3 step command  2  Language- read & follow direction  1  Write a sentence  1  Copy design  0  Total score  21     Follow Up Instructions:    -I discussed the assessment and treatment plan with the patient. The patient was provided an opportunity to ask questions and all were answered. The patient agreed with the plan and demonstrated an understanding of the instructions.   The patient was advised to call back or seek  an in-person evaluation if the symptoms worsen or if the condition fails to improve as anticipated.    Total time spent on today's visit was 30 minutes, including both face-to-face time and nonface-to-face time.  Time included that spent on review of records (prior notes available to me/labs/imaging  if pertinent), discussing treatment and goals, answering patient's questions and coordinating care.   Marlowe Kays, PA-C

## 2021-12-20 NOTE — Progress Notes (Incomplete)
Assessment/Plan:   Dementia likely due to  Lori Wall is a very pleasant 61 y.o. RH female with a history of hypertension, stroke in 2008, vascular dementia with behavioral disturbance seen today in follow up for memory loss. In review, she had lost to follow up after initial evaluation on 10/2018 at which time her MoCA blind was 12/22 . MRI in 2020 showed moderately advanced cerebral and cerebellar atrophy, chronic microvascular disease, she was on donepezil 10 mg for a time, then discontinuing the medicine. She then resumed follow up after on 05/2021 after reporting worsening of memory and falls, re-placed on donepezil 10 mg daily. MMSE at the time was 21/30.  In today's visit  Follow up in   months. Continue donepezil 10 mg daily, side effects discussed Continue to control mood as per PCP*** Recommend good control of cardiovascular risk factors    Subjective:    This patient is accompanied in the office by *** who supplements the history.  Previous records as well as any outside records available were reviewed prior to todays visit.    Any changes in memory since last visit? repeats oneself?  Endorsed Disoriented when walking into a room?  Patient denies except occasionally not remembering what patient came to the room for ***  Leaving objects in unusual places?  Patient denies   Wandering behavior?  Patient denies   Any personality changes since last visit?  Patient denies   Any worsening depression?:  Patient denies   Hallucinations or paranoia?  Patient denies   Seizures?   Patient denies    Any sleep changes?  Denies vivid dreams, REM behavior or sleepwalking   Sleep apnea?  Patient denies   Any hygiene concerns?  Patient denies   Independent of bathing and dressing?  Endorsed  Does the patient needs help with medications? is in charge *** Who is in charge of the finances?   is in charge   *** Any changes in appetite?  Patient denies ***   Patient have trouble  swallowing? Patient denies   Does the patient cook?  Any kitchen accidents such as leaving the stove on? Patient denies   Any headaches?  Patient denies   Chronic back pain Patient denies   Ambulates with difficulty?   Patient denies   Recent falls or head injuries?  Patient denies     Unilateral weakness, numbness or tingling?  Patient denies   Any tremors?  Patient denies   Any anosmia?  Patient denies   Any incontinence of urine?  Patient denies   Any bowel dysfunction?   Patient denies      Patient lives  *** Does the patient drive?***   History on Initial Assessment 02/25/2018: This is a pleasant 61 year old right-handed woman with a history of hypertension, sleep apnea, stroke in 2008 with residual mild right-sided weakness, presenting for evaluation of forgetfulness. She is a poor historian and mostly says that she is forgetful. Her daughter provides majority of the history. Her daughter reports that she had a seizure first in 2008 then had the stroke affecting her right side and speech. She was on life support for a week. She was living in [place] at that time, she had to start all over again, learning to eat, talk, and walk. Her daughter reports the stroke was due to alcohol abuse, she thinks there may have been a bleed (no prior records available). She moved in with her daughter after the stroke and has not been  drinking any alcohol since 2008. Her daughter reports "brain damage" from the stroke with some memory issues, that have worsened over the past 3-4 months where family would ask her to do something and she would do something else. For instance, they would ask her to get the trash and she would go to the sink instead. Family has also noticed that she is more off balance, no falls. The patient states "I keep forgetting." She misplaces her glasses frequently and says her daughter moved it. She has left the faucet running. She denies leaving the stove on. Her daughter has been  managing finances and medications since the stroke. She does not drive. She is noted to be looking off to a distance several times during the visit, sometimes with a brief lag in response but answers questions appropriate after. Her daughter reports she does this at home sometimes, no unresponsive episodes. No further seizures since 2008. She has had headaches since the stroke and was previously seen in a neurology practice in [place] in 2016 where she had an MRI brain reporting progressive atrophy and white matter disease since 2010. 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 This abnormality was not present in 2010. She had a follow-up CTA reporting distal right vertebral artery is patent but non dominant. This was suspected to be a normal anatomic variation rather than related to right vertebral artery stenosis in the neck. Headaches occur every other day with pressure in the left frontal region, no associated nausea/vomiting. She is sensitive to bright lights. She has been taking verapamil for HTN and headache prevention. She has been taking oxycodone BID for headaches since the stroke, no new changes in doses. She has occasional dizziness with spinning sensation when standing. No diplopia, dysphagia, neck/back pain, focal numbness/tingling, bowel/bladder dysfunction. No family history of dementia. No history of significant injuries. Mood is up and down, there is note of citalopram being started last month, but they deny taking it. Her daughter reports she has "always been tender-hearted." Daughter notes she is restless and cannot stay still. No paranoia or hallucinations. Sleep is good, she yells in her sleep. They report her mother passed away last year and this has been very hard on her.   PREVIOUS MEDICATIONS:   CURRENT MEDICATIONS:  Outpatient Encounter Medications as of 12/20/2021  Medication Sig   donepezil (ARICEPT) 10 MG tablet  Take 1 tablet daily   Magnesium Oxide 250 MG TABS Take by mouth. (Patient not taking: Reported on 05/26/2021)   Meloxicam 10 MG CAPS Take by mouth. One tablet daily as needed   Oxycodone HCl 10 MG TABS Take 10 mg by mouth 3 (three) times daily.   No facility-administered encounter medications on file as of 12/20/2021.       05/26/2021    8:00 AM  MMSE - Mini Mental State Exam  Orientation to time 3  Orientation to Place 3  Registration 3  Attention/ Calculation 3  Recall 2  Language- name 2 objects 2  Language- repeat 1  Language- follow 3 step command 2  Language- read & follow direction 1  Write a sentence 1  Copy design 0  Total score 21      10/08/2018   11:00 AM 02/25/2018    9:00 AM  Montreal Cognitive Assessment   Visuospatial/ Executive (0/5)  3  Naming (0/3)  1  Attention: Read list of digits (0/2) 1 2  Attention: Read list of  letters (0/1) 1 0  Attention: Serial 7 subtraction starting at 100 (0/3) 1 0  Language: Repeat phrase (0/2) 1 0  Language : Fluency (0/1) 0 0  Abstraction (0/2) 0 0  Delayed Recall (0/5) 2 3  Orientation (0/6) 5 5  Total  14  Adjusted Score (based on education)  15    Objective:     PHYSICAL EXAMINATION:    VITALS:  There were no vitals filed for this visit.  GEN:  The patient appears stated age and is in NAD. HEENT:  Normocephalic, atraumatic.   Neurological examination:  General: NAD, well-groomed, appears stated age. Orientation: The patient is alert. Oriented to person, place and date Cranial nerves: There is good facial symmetry.The speech is fluent and clear. No aphasia or dysarthria. Fund of knowledge is appropriate. Recent and remote memory are impaired. Attention and concentration are reduced.  Able to name objects and repeat phrases.  Hearing is intact to conversational tone.    Sensation: Sensation is intact to light touch throughout Motor: Strength is at least antigravity x4. DTR's 2/4 in UE/LE     Movement  examination: Tone: There is normal tone in the UE/LE Abnormal movements:  no tremor.  No myoclonus.  No asterixis.   Coordination:  There is no decremation with RAM's. Normal finger to nose  Gait and Station: The patient has no difficulty arising out of a deep-seated chair without the use of the hands. The patient's stride length is good.  Gait is cautious and narrow.    Thank you for allowing [place] the opportunity to participate in the care of this nice patient. Please do not hesitate to contact us for any questions or concerns.   Total time spent on today's visit was *** minutes dedicated to this patient today, preparing to see patient, examining the patient, ordering tests and/or medications and counseling the patient, documenting clinical information in the EHR or other health record, independently interpreting results and communicating results to the patient/family, discussing treatment and goals, answering patient's questions and coordinating care.  Cc:  [facility]  Marlowe Kays 12/20/2021 6:29 AM

## 2021-12-22 NOTE — Patient Instructions (Signed)
Good to see you.  Continue Donepezil 10mg  daily  2. If mood changes continue, discuss mood medication with PCP  3. Follow-up in 6 months, call for any changes   FALL PRECAUTIONS: Be cautious when walking. Scan the area for obstacles that may increase the risk of trips and falls. When getting up in the mornings, sit up at the edge of the bed for a few minutes before getting out of bed. Consider elevating the bed at the head end to avoid drop of blood pressure when getting up. Walk always in a well-lit room (use night lights in the walls). Avoid area rugs or power cords from appliances in the middle of the walkways. Use a walker or a cane if necessary and consider physical therapy for balance exercise. Get your eyesight checked regularly.  FINANCIAL OVERSIGHT: Supervision, especially oversight when making financial decisions or transactions is also recommended.  HOME SAFETY: Consider the safety of the kitchen when operating appliances like stoves, microwave oven, and blender. Consider having supervision and share cooking responsibilities until no longer able to participate in those. Accidents with firearms and other hazards in the house should be identified and addressed as well.  DRIVING: Regarding driving, in patients with progressive memory problems, driving will be impaired. We advise to have someone else do the driving if trouble finding directions or if minor accidents are reported. Independent driving assessment is available to determine safety of driving.  ABILITY TO BE LEFT ALONE: If patient is unable to contact 911 operator, consider using LifeLine, or when the need is there, arrange for someone to stay with patients. Smoking is a fire hazard, consider supervision or cessation. Risk of wandering should be assessed by caregiver and if detected at any point, supervision and safe proof recommendations should be instituted.  MEDICATION SUPERVISION: Inability to self-administer medication needs  to be constantly addressed. Implement a mechanism to ensure safe administration of the medications.  RECOMMENDATIONS FOR ALL PATIENTS WITH MEMORY PROBLEMS: 1. Continue to exercise (Recommend 30 minutes of walking everyday, or 3 hours every week) 2. Increase social interactions - continue going to [place] and enjoy social gatherings with friends and family 3. Eat healthy, avoid fried foods and eat more fruits and vegetables 4. Maintain adequate blood pressure, blood sugar, and blood cholesterol level. Reducing the risk of stroke and cardiovascular disease also helps promoting better memory. 5. Avoid stressful situations. Live a simple life and avoid aggravations. Organize your time and prepare for the next day in anticipation. 6. Sleep well, avoid any interruptions of sleep and avoid any distractions in the bedroom that may interfere with adequate sleep quality 7. Avoid sugar, avoid sweets as there is a strong link between excessive sugar intake, diabetes, and cognitive impairment The Mediterranean diet has been shown to help patients reduce the risk of progressive memory disorders and reduces cardiovascular risk. This includes eating fish, eat fruits and green leafy vegetables, nuts like almonds and hazelnuts, walnuts, and also use olive oil. Avoid fast foods and fried foods as much as possible. Avoid sweets and sugar as sugar use has been linked to worsening of memory function.  There is always a concern of gradual progression of memory problems. If this is the case, then we may need to adjust level of care according to patient needs. Support, both to the patient and caregiver, should then be put into place.

## 2022-08-13 ENCOUNTER — Other Ambulatory Visit: Payer: Self-pay

## 2022-08-13 ENCOUNTER — Other Ambulatory Visit: Payer: Self-pay | Admitting: Neurology

## 2022-08-13 MED ORDER — DONEPEZIL HCL 10 MG PO TABS: ORAL_TABLET | ORAL | 0 refills | Status: DC

## 2022-08-13 NOTE — Telephone Encounter (Signed)
Left messasge with the after hour service on 08-13-22 at 12:53 pm  Caller states patient needs a refill on the donepezil 10 mg

## 2022-08-24 ENCOUNTER — Encounter: Payer: Self-pay | Admitting: Physician Assistant

## 2022-08-24 ENCOUNTER — Ambulatory Visit: Payer: Medicare Other | Admitting: Physician Assistant

## 2023-03-11 ENCOUNTER — Other Ambulatory Visit: Payer: Self-pay | Admitting: Physician Assistant

## 2023-03-13 ENCOUNTER — Other Ambulatory Visit: Payer: Self-pay

## 2023-03-13 ENCOUNTER — Telehealth: Payer: Self-pay | Admitting: Physician Assistant

## 2023-03-13 MED ORDER — MEMANTINE HCL 5 MG PO TABS: ORAL_TABLET | ORAL | 0 refills | Status: DC

## 2023-03-13 MED ORDER — DONEPEZIL HCL 10 MG PO TABS: ORAL_TABLET | ORAL | 0 refills | Status: DC

## 2023-03-13 NOTE — Telephone Encounter (Signed)
 Caller stated patient needs refills and was advised that she needed an appointment scheduled to do so. Caller stated patient currently has Covid and daughter just had two knee surgeries so they are not able to do in office visit and would like to know can appointment be VV?

## 2023-03-13 NOTE — Telephone Encounter (Signed)
 The daughter said they will do a VV on Monday. She wants to know if the medication will be sent in today cause she is completely out. Would like a call back

## 2023-03-14 NOTE — Telephone Encounter (Signed)
 I advised will address rx on Monday per Marlowe Kays, PA-C.

## 2023-03-18 ENCOUNTER — Telehealth (INDEPENDENT_AMBULATORY_CARE_PROVIDER_SITE_OTHER): Admitting: Physician Assistant

## 2023-03-18 DIAGNOSIS — I10 Essential (primary) hypertension: Secondary | ICD-10-CM | POA: Diagnosis not present

## 2023-03-18 DIAGNOSIS — F0153 Vascular dementia, unspecified severity, with mood disturbance: Secondary | ICD-10-CM | POA: Diagnosis not present

## 2023-03-18 MED ORDER — MEMANTINE HCL 10 MG PO TABS: ORAL_TABLET | ORAL | 3 refills | Status: AC

## 2023-03-18 MED ORDER — DONEPEZIL HCL 10 MG PO TABS: ORAL_TABLET | ORAL | 3 refills | Status: AC

## 2023-03-18 NOTE — Patient Instructions (Signed)
 Good to see you.  Continue Donepezil 10mg  daily Increase memantine to 10 mg as directed.   2. If mood changes continue, discuss mood medication with PCP  3. Follow-up in 6 months, call for any changes   FALL PRECAUTIONS: Be cautious when walking. Scan the area for obstacles that may increase the risk of trips and falls. When getting up in the mornings, sit up at the edge of the bed for a few minutes before getting out of bed. Consider elevating the bed at the head end to avoid drop of blood pressure when getting up. Walk always in a well-lit room (use night lights in the walls). Avoid area rugs or power cords from appliances in the middle of the walkways. Use a walker or a cane if necessary and consider physical therapy for balance exercise. Get your eyesight checked regularly.  FINANCIAL OVERSIGHT: Supervision, especially oversight when making financial decisions or transactions is also recommended.  HOME SAFETY: Consider the safety of the kitchen when operating appliances like stoves, microwave oven, and blender. Consider having supervision and share cooking responsibilities until no longer able to participate in those. Accidents with firearms and other hazards in the house should be identified and addressed as well.  DRIVING: Regarding driving, in patients with progressive memory problems, driving will be impaired. We advise to have someone else do the driving if trouble finding directions or if minor accidents are reported. Independent driving assessment is available to determine safety of driving.  ABILITY TO BE LEFT ALONE: If patient is unable to contact 911 operator, consider using LifeLine, or when the need is there, arrange for someone to stay with patients. Smoking is a fire hazard, consider supervision or cessation. Risk of wandering should be assessed by caregiver and if detected at any point, supervision and safe proof recommendations should be instituted.  MEDICATION SUPERVISION:  Inability to self-administer medication needs to be constantly addressed. Implement a mechanism to ensure safe administration of the medications.  RECOMMENDATIONS FOR ALL PATIENTS WITH MEMORY PROBLEMS: 1. Continue to exercise (Recommend 30 minutes of walking everyday, or 3 hours every week) 2. Increase social interactions - continue going to [place] and enjoy social gatherings with friends and family 3. Eat healthy, avoid fried foods and eat more fruits and vegetables 4. Maintain adequate blood pressure, blood sugar, and blood cholesterol level. Reducing the risk of stroke and cardiovascular disease also helps promoting better memory. 5. Avoid stressful situations. Live a simple life and avoid aggravations. Organize your time and prepare for the next day in anticipation. 6. Sleep well, avoid any interruptions of sleep and avoid any distractions in the bedroom that may interfere with adequate sleep quality 7. Avoid sugar, avoid sweets as there is a strong link between excessive sugar intake, diabetes, and cognitive impairment The Mediterranean diet has been shown to help patients reduce the risk of progressive memory disorders and reduces cardiovascular risk. This includes eating fish, eat fruits and green leafy vegetables, nuts like almonds and hazelnuts, walnuts, and also use olive oil. Avoid fast foods and fried foods as much as possible. Avoid sweets and sugar as sugar use has been linked to worsening of memory function.  There is always a concern of gradual progression of memory problems. If this is the case, then we may need to adjust level of care according to patient needs. Support, both to the patient and caregiver, should then be put into place.

## 2023-03-18 NOTE — Progress Notes (Signed)
 Virtual Visit via Video Note The purpose of this virtual visit is to provide medical care in a patient that is unable to be seen in person due to physical or health limitations   Consent was obtained for video visit:  yes  Answered questions that patient had about telehealth interaction:  yes I discussed the limitations, risks, security and privacy concerns of performing an evaluation and management service by telemedicine. I also discussed with the patient that there may be a patient responsible charge related to this service. The patient expressed understanding and agreed to proceed.  Pt location: Home Physician Location: office Name of referring provider:  [facility]* I connected with Lori Wall at patients initiation/request on 03/18/2023 at  9:30 AM EDT by video enabled telemedicine application and verified that I am speaking with the correct person using two identifiers. Pt MRN:  [id number] Pt DOB:  02-Jun-1960 Video Participants:  Lori Wall;  Lori Wall     Assessment and Plan:    Lori Wall  is a 63 y.o. RH female with a history of hypertension, stroke in 2008, memory impairment likely due to vascular dementia with behavioral disturbance seen today in follow up for memory loss. In review, she had lost to follow up after initial evaluation on 10/2018 at which time her MoCA blind was 12/22 . MRI in 2020 showed moderately advanced cerebral and cerebellar atrophy, chronic microvascular disease, she was on donepezil 10 mg for a time, then discontinuing the medicine. She then resumed follow up after on 05/2021 after reporting worsening of memory and falls, re-placed on donepezil 10 mg daily. MMSE at the time was 21/30. Last seen on 12/20/21. She is seen today for medication refill.  In today's visit, memory is stable .  During her last visit, memantine 5 mg twice daily was started. Will increase her memantine to 10 mg twice daily given her young age, Lori Wall and patient agree.  She is able to participate on her ADLs as tolerated. No longer drives. Mood is managed by PCP  Follow-up in 6 months Continue donepezil 10 mg daily Increase memantine to 10 mg twice daily if tolerated Discussed Continue to control mood as per PCP Recommend good control of cardiovascular risk factors.  History of Present Illness:    Any changes in memory? "Memory may be worse"--Lori Wall reports.  She has some  difficulty remembering names and conversations from before. repeats oneself? Endorsed Disoriented when walking into a room? Denies  Leaving objects in unusual places? Denies Ambulates with difficulty?  "She always walks with a little limp, otherwise uses a walker ". Recent falls?Denies  Any head injuries?Denies Any seizures?Denies Wandering behavior? Denies Any mood changes?  As before, she may have moments of irritability.   Any worsening depression?:Denies Hallucinations?  She talks to her dead mother and today "pigeon on the wall ".  Paranoia? She continues to accuse her children of misplacing things for her, such as the wallet  Patient reports that sleeps well without vivid dreams, REM behavior or sleepwalking    Any hygiene concerns? Sometimes she may need a reminder  Independent of bathing and dressing?  Aide, Lori Wall assists her. Does the patient needs help with medications? Lori Wall is in charge  any changes in appetite?  She eats well.  Patient have trouble swallowing? Denies  Any headaches? Denies Any focal numbness or tingling? Denies  Any worsening pain or new stroke like symptoms? Denies  Any tremors? Denies  Any incontinence of urine? Denies  Any bowel  dysfunction?  Denies  Patient lives with her Lori Wall and granddaughter. No longer drives      History on Initial Assessment 02/25/2018: This is a pleasant 63 year old right-handed woman with a history of hypertension, sleep apnea, stroke in 2008 with residual mild right-sided weakness, presenting for  evaluation of forgetfulness. She is a poor historian and mostly says that she is forgetful. Her Lori Wall provides majority of the history. Her Lori Wall reports that she had a seizure first in 2008 then had the stroke affecting her right side and speech. She was on life support for a week. She was [place] in [place] at that time, she had to start all over again, learning to eat, talk, and walk. Her Lori Wall reports the stroke was due to alcohol abuse, she thinks there may have been a bleed (no prior records available). She moved in with her Lori Wall after the stroke and has not been drinking any alcohol since 2008. Her Lori Wall reports "brain damage" from the stroke with some memory issues, that have worsened over the past 3-4 months where family would ask her to do something and she would do something else. For instance, they would ask her to get the trash and she would go to the sink instead. Family has also noticed that she is more off balance, no falls. The patient states "I keep forgetting." She misplaces her glasses frequently and says her Lori Wall moved it. She has left the faucet running. She denies leaving the stove on. Her Lori Wall has been managing finances and medications since the stroke. She does not drive. She is noted to be looking off to a distance several times during the visit, sometimes with a brief lag in response but answers questions appropriate after. Her Lori Wall reports she does this at home sometimes, no unresponsive episodes. No further seizures since 2008. She has had headaches since the stroke and was previously seen in a neurology practice in [place] in 2016 where she had an MRI brain reporting progressive atrophy and white matter disease since 2010. 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 This abnormality was not present in 2010. She had a follow-up CTA reporting distal right vertebral artery is patent  but non dominant. This was suspected to be a normal anatomic variation rather than related to right vertebral artery stenosis in the neck. Headaches occur every other day with pressure in the left frontal region, no associated nausea/vomiting. She is sensitive to bright lights. She has been taking verapamil for HTN and headache prevention. She has been taking oxycodone BID for headaches since the stroke, no new changes in doses. She has occasional dizziness with spinning sensation when standing. No diplopia, dysphagia, neck/back pain, focal numbness/tingling, bowel/bladder dysfunction. No family history of dementia. No history of significant injuries. Mood is up and down, there is note of citalopram being started last month, but they deny taking it. Her Lori Wall reports she has "always been tender-hearted." Lori Wall notes she is restless and cannot stay still. No paranoia or hallucinations. Sleep is good, she yells in her sleep. They report her mother passed away last year and this has been very hard on her.    Current Outpatient Medications on File Prior to Visit  Medication Sig Dispense Refill   donepezil (ARICEPT) 10 MG tablet Take 1 tablet daily 30 tablet 0   Magnesium Oxide 250 MG TABS Take by mouth. (Patient not taking: Reported on 05/26/2021)  Meloxicam 10 MG CAPS Take by mouth. One tablet daily as needed     memantine (NAMENDA) 5 MG tablet Take 1 tablet (5 mg at night) for 2 weeks, then increase to 1 tablet (5 mg) twice a day 60 tablet 0   Oxycodone HCl 10 MG TABS Take 10 mg by mouth 3 (three) times daily.     No current facility-administered medications on file prior to visit.     Observations/Objective:   There were no vitals filed for this visit. GEN:  The patient appears stated age and is in NAD.  Neurological examination: Patient is awake, alert, oriented to person,  place or not to date. No aphasia or dysarthria. Intact fluency, Normal  comprehension. Remote and recent memory  impaired.Able to repeat phrases and name objects. Cranial nerves: Extraocular movements intact with no nystagmus. No facial asymmetry. Motor: moves all extremities symmetrically, at least anti-gravity x 4. No incoordination on finger to nose testing . Gait: narrow-based and steady, able to tandem walk adequately.      Follow Up Instructions:    -I discussed the assessment and treatment plan with the patient. The patient was provided an opportunity to ask questions and all were answered. The patient agreed with the plan and demonstrated an understanding of the instructions.   The patient was advised to call back or seek an in-person evaluation if the symptoms worsen or if the condition fails to improve as anticipated.    Total time spent on today's visit was 14 minutes, including both face-to-face time and nonface-to-face time.  Time included that spent on review of records (prior notes available to me/labs/imaging if pertinent), discussing treatment and goals, answering patient's questions and coordinating care.   Marlowe Kays, PA-C
